# Patient Record
Sex: Male | Born: 1965 | Race: White | Hispanic: No | Marital: Married | Smoking: Former smoker
Health system: Southern US, Community
[De-identification: ages and names within clinical notes are randomized; demographics above are authoritative.]

## PROBLEM LIST (undated history)

## (undated) DIAGNOSIS — M1990 Unspecified osteoarthritis, unspecified site: Secondary | ICD-10-CM

## (undated) DIAGNOSIS — G4733 Obstructive sleep apnea (adult) (pediatric): Secondary | ICD-10-CM

## (undated) DIAGNOSIS — G4730 Sleep apnea, unspecified: Secondary | ICD-10-CM

## (undated) DIAGNOSIS — M545 Low back pain, unspecified: Secondary | ICD-10-CM

## (undated) DIAGNOSIS — T4145XA Adverse effect of unspecified anesthetic, initial encounter: Secondary | ICD-10-CM

## (undated) DIAGNOSIS — E291 Testicular hypofunction: Secondary | ICD-10-CM

## (undated) DIAGNOSIS — K219 Gastro-esophageal reflux disease without esophagitis: Secondary | ICD-10-CM

## (undated) DIAGNOSIS — J302 Other seasonal allergic rhinitis: Secondary | ICD-10-CM

## (undated) DIAGNOSIS — G8929 Other chronic pain: Secondary | ICD-10-CM

## (undated) DIAGNOSIS — T8859XA Other complications of anesthesia, initial encounter: Secondary | ICD-10-CM

## (undated) DIAGNOSIS — I10 Essential (primary) hypertension: Secondary | ICD-10-CM

## (undated) HISTORY — DX: Testicular hypofunction: E29.1

## (undated) HISTORY — PX: VASECTOMY: SHX75

## (undated) HISTORY — PX: BACK SURGERY: SHX140

## (undated) HISTORY — PX: APPENDECTOMY: SHX54

## (undated) HISTORY — DX: Obstructive sleep apnea (adult) (pediatric): G47.33

## (undated) HISTORY — PX: POSTERIOR LUMBAR FUSION: SHX6036

## (undated) HISTORY — DX: Other seasonal allergic rhinitis: J30.2

---

## 1988-10-04 HISTORY — PX: NASAL SINUS SURGERY: SHX719

## 1991-02-04 HISTORY — PX: TONSILLECTOMY: SUR1361

## 1999-02-04 HISTORY — PX: KNEE ARTHROSCOPY: SHX127

## 2001-06-23 ENCOUNTER — Encounter: Admission: RE | Admit: 2001-06-23 | Discharge: 2001-06-23 | Payer: Self-pay | Admitting: Family Medicine

## 2001-06-23 ENCOUNTER — Encounter: Payer: Self-pay | Admitting: Family Medicine

## 2001-06-24 ENCOUNTER — Encounter: Payer: Self-pay | Admitting: Family Medicine

## 2001-06-24 ENCOUNTER — Encounter: Admission: RE | Admit: 2001-06-24 | Discharge: 2001-06-24 | Payer: Self-pay | Admitting: Family Medicine

## 2005-08-13 ENCOUNTER — Inpatient Hospital Stay (HOSPITAL_COMMUNITY): Admission: RE | Admit: 2005-08-13 | Discharge: 2005-08-19 | Payer: Self-pay | Admitting: Neurological Surgery

## 2005-09-22 ENCOUNTER — Encounter: Admission: RE | Admit: 2005-09-22 | Discharge: 2005-09-22 | Payer: Self-pay | Admitting: Neurological Surgery

## 2006-02-23 ENCOUNTER — Encounter: Admission: RE | Admit: 2006-02-23 | Discharge: 2006-02-23 | Payer: Self-pay | Admitting: Neurological Surgery

## 2006-08-24 ENCOUNTER — Encounter: Admission: RE | Admit: 2006-08-24 | Discharge: 2006-08-24 | Payer: Self-pay | Admitting: Neurological Surgery

## 2010-06-21 NOTE — Op Note (Signed)
NAMECOLUMBUS, ICE NO.:  [id number]   MEDICAL RECORD NO.:  [id number]          PATIENT TYPE:  INP   LOCATION:  2899                         FACILITY:  [facility]   PHYSICIAN:  Tia Alert, MD     DATE OF BIRTH:  03/05/1965   DATE OF PROCEDURE:  08/13/2005  DATE OF DISCHARGE:                                 OPERATIVE REPORT   PREOPERATIVE DIAGNOSIS:  1.  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   POSTOPERATIVE DIAGNOSIS:  1.  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   PROCEDURE:  1.  Decompressive lumbar laminectomy, hemifacetectomy, and foraminotomies at      L4-L5 requiring more work than is usually required for simple posterior      lumbar interbody fusion procedure to decompress the central canal and L4      and -L5 nerve roots.  2.  Posterior lumbar interbody fusion L4-L5 utilizing a 10 x 22 mm PEEK      interbody cage packed with local autograft and VITOSS which was soaked      with a bone marrow aspirate obtained through a separate fascial incision      and a 10 x 22 mm Tangent interbody bone wedge.  3.  Intertransverse arthrodesis L4-L5 utilizing local bone and VITOSS soaked      with bone marrow aspirate.  4.  Nonsegmental fixation L4-L5 utilizing the Legacy pedicle screw system.  5.  Bone marrow aspirate obtained through a separate fascial incision.   SURGEON:  Tia Alert, M.D.   ASSISTANT:  Donalee Citrin, M.D.   ANESTHESIA:  General endotracheal anesthesia.   COMPLICATIONS:  None apparent.   INDICATIONS FOR PROCEDURE:  [name] is a 45 year old male who has a long  history of back pain with leg pain.  He had an MRI about a year ago which  showed some mild disk disease at L4-5 with moderate stenosis at this level.  He tried medical management for about a year including physical therapy and  steroid injections and pain medications.  He had a  repeat MRI about a month  ago which showed significant changes at L4-L5 with now endplate changes,  collapse of the disk space, and severe degenerative disk disease which was  much worse than his previous MRI.  He also continued to have moderate  stenosis at this level.  I recommended a posterior lumbar interbody fusion  at L4-L5.  He understood the risks, benefits, and expected outcome and  wished to proceed in hopes of improving his pain syndrome.   DESCRIPTION OF PROCEDURE:  The patient was taken to the operating room.  After induction of adequate generalized endotracheal anesthesia, he was  rolled in the prone position on chest rolls and all pressure points were  padded.  His lumbar region was prepped with DuraPrep and draped in the usual  sterile fashion.  A dorsal midline incision was made and carried down to the  lumbosacral fascia.  I then dissected superior to the fascia out over the  right iliac crest.  I opened the fascia there and used a large Tuohy needle  to perform bone marrow aspirate to soak 10 mL of VITOSS.  I then closed the  subcutaneous tissues to the fascia and then opened the fascia and took down  the fascia and the paraspinous musculature in a subperiosteal fashion to  expose the L4-L5 interspace.  Once interoperative fluoroscopy confirmed my  level, I took the dissection out over the transverse processes of L4 and L5.  I then used a combination of Leksell rongeur and Kerrison punches to perform  a complete laminectomy, hemifacetectomy, and foraminotomy at L4-L5. The L4  and L5 nerve roots were identified.  He had quite a bit of overgrown yellow  ligament which was removed in a piecemeal fashion with a Kerrison punch.  I  followed the L4 and L5 nerve roots distally into their respective foramina  marching along the pedicle wall.  I was able to undercut the lamina of L3,  also.  I then coagulated the epidural venous vasculature and incised the  disk space bilaterally  and performed the initial diskectomy with pituitary  rongeurs.  We then used a combination of the rotating cutter, round scraper,  and 10 mm cutting chisel to prepare the endplates bilaterally at L4-L5 after  distracting the interspace up to 10 mm.  Once the endplates were prepared  and complete diskectomy was done, we placed a 10 x 22 mm PEEK interbody cage  packed with local autograft and VITOSS soaked with the bone marrow aspirate  on the patient's right side and a Tangent interbody bone wedge on the left.  The midline was packed with local autograft and VITOSS.  We then turned our  attention to the nonsegmental fixation.  We localized the pedicle screw  entry zones using fluoroscopy and surface landmarks. We probed each pedicle  with a pedicle probe, tapped each pedicle with a 5/5 tap, made sure that we  had not broken through the cortex with a ball probe, and then placed 6.5 by  45 mm pedicle screws in the pedicles of L4 and L5 bilaterally.  We then  decorticated the transverse processes and placed a mixture of local  autograft out over these. This was mixed with the VITOSS.  We then used 40-  mm rods and placed these into the multiaxial screw heads of the pedicle  screws and locked these into position with the locking caps and the anti-  torque device after achieving compression on the grafts.  We then irrigated  once again with saline solution containing bacitracin, dried all bleeding  points with bipolar cautery, inspected our nerve roots once again with a  coronary dilator out into the foramina, lined the dura with Gelfoam, placed  a medium Hemovac drain through a separate stab incision, and closed the  muscle and fascia with interrupted #1 Vicryl, closed the subcutaneous and  subcuticular tissues with 2-0 and 3-0 Vicryl, and closed the skin with  Benzoin and Steri-Strips.  The drapes were removed.  A sterile dressing was applied.  The patient was awakened from general anesthesia  and transferred  to the recovery room in stable condition.  At the end of the procedure, all  sponge, needle and sponge counts were correct.      Tia Alert, MD  Electronically Signed     DSJ/MEDQ  D:  08/13/2005  T:  08/13/2005  Job:  [id number]

## 2010-06-21 NOTE — Discharge Summary (Signed)
Joe Holmes, Joe Holmes NO.:  [id number]   MEDICAL RECORD NO.:  [id number]          PATIENT TYPE:  INP   LOCATION:  3012                         FACILITY:  [facility]   PHYSICIAN:  Tia Alert, MD     DATE OF BIRTH:  18-Jan-1966   DATE OF ADMISSION:  08/13/2005  DATE OF DISCHARGE:  08/19/2005                                 DISCHARGE SUMMARY   ADMISSION DIAGNOSIS:  Degenerative disc disease with lumbar spinal stenosis  at L4-5.   PROCEDURE:  Posterior lumbar interbody fusion L4-5.   BRIEF HISTORY OF PRESENT ILLNESS:  Joe Holmes is a 45 year old male who has  a greater than 1-year history of back pain with leg pain.  He had an MRI  which showed severe degenerative disc disease at L4-5 with spinal stenosis.  Recommended posterior lumbar antibody fusion L4-5.  He understood the risks,  benefits, and expected outcome and wished to proceed.   HOSPITAL COURSE:  The patient was admitted on August 13, 2005 and taken to  operating room, where underwent a posterior lumbar interbody fusion L4-5.  The patient tolerated the procedure and was taken to recovery room and then  to the neurosurgical floor in stable condition.  For details of the  operative procedure, please see the dictated operative note.  The patient's  hospital course was fairly routine.  He did have a postoperative fever which  was worked up with a chest x-ray which just showed atelectasis.  Urinalysis  was clean.  The fever lasted for 2 days. Otherwise, he had appropriate  postoperative back pain, but no leg pain.  His incision remained clean, dry,  and intact.  He was tolerating a regular diet and ambulating in the halls  without difficulty.  Once he defervesced, he felt much better.  He was  afebrile for 24 hours and was discharged home in stable condition.   DISCHARGE MEDICATIONS:  1.  Percocet 5/325, 1-2 p.o. q.6 h, p.r.n. pain, 90 pills, and no refills.  2.  He was given 10 Fioricet for headache which he  experienced in the      hospital.  3.  He was also given Valium for muscle relaxation.   His return appointment is in 2 weeks with Dr. Yetta Barre.      Tia Alert, MD  Electronically Signed     DSJ/MEDQ  D:  08/19/2005  T:  08/19/2005  Job:  [id number]

## 2013-04-08 ENCOUNTER — Other Ambulatory Visit: Payer: Self-pay | Admitting: Neurological Surgery

## 2013-04-19 ENCOUNTER — Encounter (HOSPITAL_COMMUNITY): Payer: Self-pay | Admitting: Pharmacy Technician

## 2013-04-22 ENCOUNTER — Encounter (HOSPITAL_COMMUNITY)
Admission: RE | Admit: 2013-04-22 | Discharge: 2013-04-22 | Disposition: A | Discharge status: Home / Self Care | Payer: PRIVATE HEALTH INSURANCE | Source: Ambulatory Visit | Attending: Neurological Surgery | Admitting: Neurological Surgery

## 2013-04-22 ENCOUNTER — Encounter (HOSPITAL_COMMUNITY): Payer: Self-pay

## 2013-04-22 DIAGNOSIS — Z01810 Encounter for preprocedural cardiovascular examination: Secondary | ICD-10-CM | POA: Insufficient documentation

## 2013-04-22 DIAGNOSIS — Z01812 Encounter for preprocedural laboratory examination: Secondary | ICD-10-CM | POA: Insufficient documentation

## 2013-04-22 DIAGNOSIS — Z01818 Encounter for other preprocedural examination: Secondary | ICD-10-CM | POA: Insufficient documentation

## 2013-04-22 HISTORY — DX: Sleep apnea, unspecified: G47.30

## 2013-04-22 HISTORY — DX: Essential (primary) hypertension: I10

## 2013-04-22 HISTORY — DX: Unspecified osteoarthritis, unspecified site: M19.90

## 2013-04-22 HISTORY — DX: Gastro-esophageal reflux disease without esophagitis: K21.9

## 2013-04-22 HISTORY — DX: Adverse effect of unspecified anesthetic, initial encounter: T41.45XA

## 2013-04-22 HISTORY — DX: Other complications of anesthesia, initial encounter: T88.59XA

## 2013-04-22 LAB — CBC WITH DIFFERENTIAL/PLATELET
BASOS PCT: 0 % (ref 0–1)
Basophils Absolute: 0 10*3/uL (ref 0.0–0.1)
EOS ABS: 0.2 10*3/uL (ref 0.0–0.7)
Eosinophils Relative: 2 % (ref 0–5)
HCT: 46.4 % (ref 39.0–52.0)
HEMOGLOBIN: 16.7 g/dL (ref 13.0–17.0)
LYMPHS ABS: 2.8 10*3/uL (ref 0.7–4.0)
LYMPHS PCT: 28 % (ref 12–46)
MCH: 32.8 pg (ref 26.0–34.0)
MCHC: 36 g/dL (ref 30.0–36.0)
MCV: 91.2 fL (ref 78.0–100.0)
MONOS PCT: 11 % (ref 3–12)
Monocytes Absolute: 1.1 10*3/uL — ABNORMAL HIGH (ref 0.1–1.0)
NEUTROS PCT: 59 % (ref 43–77)
Neutro Abs: 5.8 10*3/uL (ref 1.7–7.7)
PLATELETS: 214 10*3/uL (ref 150–400)
RBC: 5.09 MIL/uL (ref 4.22–5.81)
RDW: 12.8 % (ref 11.5–15.5)
WBC: 9.9 10*3/uL (ref 4.0–10.5)

## 2013-04-22 LAB — BASIC METABOLIC PANEL
BUN: 10 mg/dL (ref 6–23)
CO2: 26 mEq/L (ref 19–32)
Calcium: 9.9 mg/dL (ref 8.4–10.5)
Chloride: 101 mEq/L (ref 96–112)
Creatinine, Ser: 1.06 mg/dL (ref 0.50–1.35)
GFR calc Af Amer: >90 mL/min (ref >90)
GFR calc non Af Amer: 81 mL/min — ABNORMAL LOW (ref >90)
GLUCOSE: 95 mg/dL (ref 70–99)
POTASSIUM: 4.8 meq/L (ref 3.7–5.3)
Sodium: 141 mEq/L (ref 137–147)

## 2013-04-22 LAB — TYPE AND SCREEN
ABO/RH(D): O POS
ANTIBODY SCREEN: NEGATIVE

## 2013-04-22 LAB — SURGICAL PCR SCREEN
MRSA, PCR: NEGATIVE
STAPHYLOCOCCUS AUREUS: NEGATIVE

## 2013-04-22 LAB — PROTIME-INR
INR: 1.08 (ref 0.00–1.49)
Prothrombin Time: 13.8 seconds (ref 11.6–15.2)

## 2013-04-22 NOTE — Pre-Procedure Instructions (Addendum)
Roanna Epleylvis S Morath  04/22/2013   Your procedure is scheduled on:  04/28/13  [facility] at 730 AM.  Call this number if you have problems the morning of surgery: [phone]   Remember:   Do not eat food or drink liquids after midnight.   Take these medicines the morning of surgery with A SIP OF WATER: tylenol, flexeril if needed           STOP all herbel meds, nsaids (aleve,naproxen,advil,ibuprofen) 5 days prior to surgery including vitamins, aspirin   Do not wear jewelry, make-up or nail polish.  Do not wear lotions, powders, or perfumes. You may wear deodorant.  Do not shave 48 hours prior to surgery. Men may shave face and neck.  Do not bring valuables to the hospital.  [facility]                  for any belongings or valuables.               Contacts, dentures or bridgework may not be worn into surgery.  Leave suitcase in the car. After surgery it may be brought to your room.  For patients admitted to the hospital, discharge time is determined by your                treatment team.               Patients discharged the day of surgery will not be allowed to drive  home.  Name and phone number of your driver:   Special Instructions:  Special Instructions: [facility] - Preparing for Surgery  Before surgery, you can play an important role.  Because skin is not sterile, your skin needs to be as free of germs as possible.  You can reduce the number of germs on you skin by washing with CHG (chlorahexidine gluconate) soap before surgery.  CHG is an antiseptic cleaner which kills germs and bonds with the skin to continue killing germs even after washing.  Please DO NOT use if you have an allergy to CHG or antibacterial soaps.  If your skin becomes reddened/irritated stop using the CHG and inform your nurse when you arrive at Short Stay.  Do not shave (including legs and underarms) for at least 48 hours prior to the first CHG shower.  You may  shave your face.  Please follow these instructions carefully:   1.  Shower with CHG Soap the night before surgery and the morning of Surgery.  2.  If you choose to wash your hair, wash your hair first as usual with your normal shampoo.  3.  After you shampoo, rinse your hair and body thoroughly to remove the Shampoo.  4.  Use CHG as you would any other liquid soap.  You can apply chg directly  to the skin and wash gently with scrungie or a clean washcloth.  5.  Apply the CHG Soap to your body ONLY FROM THE NECK DOWN.  Do not use on open wounds or open sores.  Avoid contact with your eyes ears, mouth and genitals (private parts).  Wash genitals (private parts)       with your normal soap.  6.  Wash thoroughly, paying special attention to the area where your surgery will be performed.  7.  Thoroughly rinse your body with warm water from the neck down.  8.  DO NOT shower/wash with your normal soap after using and rinsing  off the CHG Soap.  9.  Pat yourself dry with a clean towel.            10.  Wear clean pajamas.            11.  Place clean sheets on your bed the night of your first shower and do not sleep with pets.  Day of Surgery  Do not apply any lotions/deodorants the morning of surgery.  Please wear clean clothes to the hospital/surgery center.   Please read over the following fact sheets that you were given: Pain Booklet, Coughing and Deep Breathing, Blood Transfusion Information, MRSA Information and Surgical Site Infection Prevention

## 2013-04-27 MED ORDER — CEFAZOLIN SODIUM-DEXTROSE 2-3 GM-% IV SOLR
2.0000 g | INTRAVENOUS | Status: AC
  Administered 2013-04-28: 3 g via INTRAVENOUS
  Filled 2013-04-27: qty 50

## 2013-04-27 MED ORDER — DEXAMETHASONE SODIUM PHOSPHATE 10 MG/ML IJ SOLN
10.0000 mg | INTRAMUSCULAR | Status: AC
  Administered 2013-04-28: 10 mg via INTRAVENOUS
  Filled 2013-04-27: qty 1

## 2013-04-28 ENCOUNTER — Inpatient Hospital Stay (HOSPITAL_COMMUNITY): Payer: PRIVATE HEALTH INSURANCE | Admitting: Anesthesiology

## 2013-04-28 ENCOUNTER — Encounter (HOSPITAL_COMMUNITY): Payer: Self-pay | Admitting: *Deleted

## 2013-04-28 ENCOUNTER — Encounter (HOSPITAL_COMMUNITY): Payer: PRIVATE HEALTH INSURANCE | Admitting: Anesthesiology

## 2013-04-28 ENCOUNTER — Inpatient Hospital Stay (HOSPITAL_COMMUNITY): Payer: PRIVATE HEALTH INSURANCE

## 2013-04-28 ENCOUNTER — Inpatient Hospital Stay (HOSPITAL_COMMUNITY)
Admission: RE | Admit: 2013-04-28 | Discharge: 2013-05-01 | DRG: 460 | Disposition: A | Discharge status: Home / Self Care | Payer: PRIVATE HEALTH INSURANCE | Source: Ambulatory Visit | Attending: Neurological Surgery | Admitting: Neurological Surgery

## 2013-04-28 ENCOUNTER — Encounter (HOSPITAL_COMMUNITY)
Admission: RE | Disposition: A | Discharge status: Home / Self Care | Payer: Self-pay | Source: Ambulatory Visit | Attending: Neurological Surgery

## 2013-04-28 DIAGNOSIS — I10 Essential (primary) hypertension: Secondary | ICD-10-CM | POA: Diagnosis present

## 2013-04-28 DIAGNOSIS — K219 Gastro-esophageal reflux disease without esophagitis: Secondary | ICD-10-CM | POA: Diagnosis present

## 2013-04-28 DIAGNOSIS — Z981 Arthrodesis status: Secondary | ICD-10-CM

## 2013-04-28 DIAGNOSIS — Z79899 Other long term (current) drug therapy: Secondary | ICD-10-CM

## 2013-04-28 DIAGNOSIS — Z87891 Personal history of nicotine dependence: Secondary | ICD-10-CM

## 2013-04-28 DIAGNOSIS — M48061 Spinal stenosis, lumbar region without neurogenic claudication: Principal | ICD-10-CM | POA: Diagnosis present

## 2013-04-28 HISTORY — DX: Other chronic pain: G89.29

## 2013-04-28 HISTORY — DX: Low back pain: M54.5

## 2013-04-28 HISTORY — DX: Low back pain, unspecified: M54.50

## 2013-04-28 SURGERY — POSTERIOR LUMBAR FUSION 1 LEVEL
Anesthesia: General | Site: Back

## 2013-04-28 MED ORDER — FENTANYL CITRATE 0.05 MG/ML IJ SOLN
INTRAMUSCULAR | Status: AC
  Filled 2013-04-28: qty 5

## 2013-04-28 MED ORDER — DIAZEPAM 5 MG PO TABS
ORAL_TABLET | ORAL | Status: AC
  Filled 2013-04-28: qty 1

## 2013-04-28 MED ORDER — SODIUM CHLORIDE 0.9 % IJ SOLN: 3.0000 mL | INTRAMUSCULAR | Status: DC | PRN

## 2013-04-28 MED ORDER — OXYCODONE HCL 5 MG/5ML PO SOLN: 5.0000 mg | Freq: Once | ORAL | Status: AC | PRN

## 2013-04-28 MED ORDER — OXYCODONE-ACETAMINOPHEN 5-325 MG PO TABS
1.0000 | ORAL_TABLET | ORAL | Status: DC | PRN
  Administered 2013-04-28: 1 via ORAL
  Administered 2013-04-29: 2 via ORAL
  Filled 2013-04-28: qty 1
  Filled 2013-04-28: qty 2

## 2013-04-28 MED ORDER — MENTHOL 3 MG MT LOZG: 1.0000 | LOZENGE | OROMUCOSAL | Status: DC | PRN

## 2013-04-28 MED ORDER — SODIUM CHLORIDE 0.9 % IJ SOLN
3.0000 mL | Freq: Two times a day (BID) | INTRAMUSCULAR | Status: DC
Start: 2013-04-28 — End: 2013-05-01
  Administered 2013-04-28 – 2013-04-30 (×4): 3 mL via INTRAVENOUS

## 2013-04-28 MED ORDER — HYDROMORPHONE HCL PF 1 MG/ML IJ SOLN
INTRAMUSCULAR | Status: AC
  Filled 2013-04-28: qty 1

## 2013-04-28 MED ORDER — GLYCOPYRROLATE 0.2 MG/ML IJ SOLN
INTRAMUSCULAR | Status: AC
  Filled 2013-04-28: qty 3

## 2013-04-28 MED ORDER — DEXAMETHASONE SODIUM PHOSPHATE 4 MG/ML IJ SOLN
4.0000 mg | Freq: Four times a day (QID) | INTRAMUSCULAR | Status: DC
  Filled 2013-04-28 (×12): qty 1

## 2013-04-28 MED ORDER — BUPIVACAINE HCL (PF) 0.25 % IJ SOLN
INTRAMUSCULAR | Status: DC | PRN
  Administered 2013-04-28: 5 mL

## 2013-04-28 MED ORDER — ACETAMINOPHEN 325 MG PO TABS: 325.0000 mg | ORAL_TABLET | ORAL | Status: DC | PRN

## 2013-04-28 MED ORDER — CEFAZOLIN SODIUM 1-5 GM-% IV SOLN
INTRAVENOUS | Status: AC
  Administered 2013-04-28: 1000 mg
  Filled 2013-04-28: qty 50

## 2013-04-28 MED ORDER — 0.9 % SODIUM CHLORIDE (POUR BTL) OPTIME
TOPICAL | Status: DC | PRN
  Administered 2013-04-28: 1000 mL

## 2013-04-28 MED ORDER — ONDANSETRON HCL 4 MG/2ML IJ SOLN
4.0000 mg | INTRAMUSCULAR | Status: DC | PRN
  Administered 2013-04-28: 4 mg via INTRAVENOUS
  Filled 2013-04-28: qty 2

## 2013-04-28 MED ORDER — HYDROMORPHONE HCL PF 1 MG/ML IJ SOLN
0.2500 mg | INTRAMUSCULAR | Status: DC | PRN
  Administered 2013-04-28 (×4): 0.5 mg via INTRAVENOUS

## 2013-04-28 MED ORDER — LIDOCAINE HCL (CARDIAC) 20 MG/ML IV SOLN
INTRAVENOUS | Status: DC | PRN
  Administered 2013-04-28: 80 mg via INTRAVENOUS

## 2013-04-28 MED ORDER — ACETAMINOPHEN 325 MG PO TABS: 650.0000 mg | ORAL_TABLET | ORAL | Status: DC | PRN

## 2013-04-28 MED ORDER — CELECOXIB 200 MG PO CAPS
200.0000 mg | ORAL_CAPSULE | Freq: Two times a day (BID) | ORAL | Status: DC
  Administered 2013-04-28 – 2013-05-01 (×6): 200 mg via ORAL
  Filled 2013-04-28 (×7): qty 1

## 2013-04-28 MED ORDER — PROPOFOL 10 MG/ML IV BOLUS
INTRAVENOUS | Status: AC
  Filled 2013-04-28: qty 20

## 2013-04-28 MED ORDER — CEFAZOLIN SODIUM 1-5 GM-% IV SOLN
1.0000 g | Freq: Three times a day (TID) | INTRAVENOUS | Status: AC
  Administered 2013-04-28 – 2013-04-29 (×2): 1 g via INTRAVENOUS
  Filled 2013-04-28 (×2): qty 50

## 2013-04-28 MED ORDER — MIDAZOLAM HCL 2 MG/2ML IJ SOLN
INTRAMUSCULAR | Status: AC
  Filled 2013-04-28: qty 2

## 2013-04-28 MED ORDER — ONDANSETRON HCL 4 MG/2ML IJ SOLN
INTRAMUSCULAR | Status: AC
  Filled 2013-04-28: qty 2

## 2013-04-28 MED ORDER — DIAZEPAM 5 MG PO TABS
5.0000 mg | ORAL_TABLET | Freq: Four times a day (QID) | ORAL | Status: DC | PRN
  Administered 2013-04-28 – 2013-04-29 (×3): 5 mg via ORAL
  Filled 2013-04-28 (×3): qty 1

## 2013-04-28 MED ORDER — POTASSIUM CHLORIDE IN NACL 20-0.9 MEQ/L-% IV SOLN
INTRAVENOUS | Status: DC
  Administered 2013-04-28: 75 mL/h via INTRAVENOUS
  Filled 2013-04-28 (×6): qty 1000

## 2013-04-28 MED ORDER — SODIUM CHLORIDE 0.9 % IR SOLN
Status: DC | PRN
  Administered 2013-04-28: 10:00:00

## 2013-04-28 MED ORDER — SENNA 8.6 MG PO TABS
1.0000 | ORAL_TABLET | Freq: Two times a day (BID) | ORAL | Status: DC
  Administered 2013-04-28 – 2013-05-01 (×6): 8.6 mg via ORAL
  Filled 2013-04-28 (×7): qty 1

## 2013-04-28 MED ORDER — MIDAZOLAM HCL 5 MG/5ML IJ SOLN
INTRAMUSCULAR | Status: DC | PRN
  Administered 2013-04-28: 2 mg via INTRAVENOUS

## 2013-04-28 MED ORDER — LACTATED RINGERS IV SOLN
INTRAVENOUS | Status: DC
  Administered 2013-04-28: 09:00:00 via INTRAVENOUS

## 2013-04-28 MED ORDER — MORPHINE SULFATE 2 MG/ML IJ SOLN
1.0000 mg | INTRAMUSCULAR | Status: DC | PRN
  Administered 2013-04-28: 2 mg via INTRAVENOUS
  Filled 2013-04-28: qty 1

## 2013-04-28 MED ORDER — ACETAMINOPHEN 160 MG/5ML PO SOLN
325.0000 mg | ORAL | Status: DC | PRN
  Filled 2013-04-28: qty 20.3

## 2013-04-28 MED ORDER — THROMBIN 20000 UNITS EX SOLR
OROMUCOSAL | Status: DC | PRN
  Administered 2013-04-28: 11:00:00 via TOPICAL

## 2013-04-28 MED ORDER — ROCURONIUM BROMIDE 100 MG/10ML IV SOLN
INTRAVENOUS | Status: DC | PRN
  Administered 2013-04-28 (×2): 50 mg via INTRAVENOUS

## 2013-04-28 MED ORDER — SODIUM CHLORIDE 0.9 % IV SOLN: 250.0000 mL | INTRAVENOUS | Status: DC

## 2013-04-28 MED ORDER — LACTATED RINGERS IV SOLN
INTRAVENOUS | Status: DC | PRN
  Administered 2013-04-28 (×3): via INTRAVENOUS

## 2013-04-28 MED ORDER — ACETAMINOPHEN 650 MG RE SUPP: 650.0000 mg | RECTAL | Status: DC | PRN

## 2013-04-28 MED ORDER — OXYCODONE HCL 5 MG PO TABS
5.0000 mg | ORAL_TABLET | Freq: Once | ORAL | Status: AC | PRN
  Administered 2013-04-28: 5 mg via ORAL

## 2013-04-28 MED ORDER — PROPOFOL 10 MG/ML IV BOLUS
INTRAVENOUS | Status: DC | PRN
  Administered 2013-04-28: 200 mg via INTRAVENOUS

## 2013-04-28 MED ORDER — PHENOL 1.4 % MT LIQD: 1.0000 | OROMUCOSAL | Status: DC | PRN

## 2013-04-28 MED ORDER — LISINOPRIL 10 MG PO TABS
10.0000 mg | ORAL_TABLET | Freq: Every day | ORAL | Status: DC
  Administered 2013-04-29 – 2013-05-01 (×3): 10 mg via ORAL
  Filled 2013-04-28 (×3): qty 1

## 2013-04-28 MED ORDER — FENTANYL CITRATE 0.05 MG/ML IJ SOLN
INTRAMUSCULAR | Status: DC | PRN
  Administered 2013-04-28 (×5): 50 ug via INTRAVENOUS
  Administered 2013-04-28: 150 ug via INTRAVENOUS
  Administered 2013-04-28 (×2): 50 ug via INTRAVENOUS

## 2013-04-28 MED ORDER — THROMBIN 5000 UNITS EX SOLR
OROMUCOSAL | Status: DC | PRN
  Administered 2013-04-28: 11:00:00 via TOPICAL

## 2013-04-28 MED ORDER — LIDOCAINE HCL (CARDIAC) 20 MG/ML IV SOLN
INTRAVENOUS | Status: AC
  Filled 2013-04-28: qty 5

## 2013-04-28 MED ORDER — NEOSTIGMINE METHYLSULFATE 1 MG/ML IJ SOLN
INTRAMUSCULAR | Status: DC | PRN
  Administered 2013-04-28: 4 mg via INTRAVENOUS

## 2013-04-28 MED ORDER — ONDANSETRON HCL 4 MG/2ML IJ SOLN
INTRAMUSCULAR | Status: DC | PRN
  Administered 2013-04-28: 4 mg via INTRAVENOUS

## 2013-04-28 MED ORDER — OXYCODONE HCL 5 MG PO TABS
ORAL_TABLET | ORAL | Status: AC
  Filled 2013-04-28: qty 1

## 2013-04-28 MED ORDER — ALUM & MAG HYDROXIDE-SIMETH 200-200-20 MG/5ML PO SUSP
15.0000 mL | Freq: Once | ORAL | Status: AC
  Administered 2013-04-28: 15 mL via ORAL
  Filled 2013-04-28: qty 30

## 2013-04-28 MED ORDER — DEXAMETHASONE 4 MG PO TABS
4.0000 mg | ORAL_TABLET | Freq: Four times a day (QID) | ORAL | Status: DC
  Administered 2013-04-28 – 2013-05-01 (×11): 4 mg via ORAL
  Filled 2013-04-28 (×15): qty 1

## 2013-04-28 MED ORDER — ONDANSETRON HCL 4 MG/2ML IJ SOLN: 4.0000 mg | Freq: Once | INTRAMUSCULAR | Status: DC | PRN

## 2013-04-28 MED ORDER — GLYCOPYRROLATE 0.2 MG/ML IJ SOLN
INTRAMUSCULAR | Status: DC | PRN
  Administered 2013-04-28: 0.6 mg via INTRAVENOUS

## 2013-04-28 SURGICAL SUPPLY — 65 items
APL SKNCLS STERI-STRIP NONHPOA (GAUZE/BANDAGES/DRESSINGS) ×1
BAG DECANTER FOR FLEXI CONT (MISCELLANEOUS) ×2 IMPLANT
BENZOIN TINCTURE PRP APPL 2/3 (GAUZE/BANDAGES/DRESSINGS) ×2 IMPLANT
BLADE SURG ROTATE 9660 (MISCELLANEOUS) IMPLANT
BONE MATRIX OSTEOCEL PRO MED (Bone Implant) ×1 IMPLANT
BUR MATCHSTICK NEURO 3.0 LAGG (BURR) ×2 IMPLANT
CAGE COROENT 10X9X28-8 LUMBAR (Cage) ×1 IMPLANT
CANISTER SUCT 3000ML (MISCELLANEOUS) ×2 IMPLANT
CONT SPEC 4OZ CLIKSEAL STRL BL (MISCELLANEOUS) ×4 IMPLANT
COVER BACK TABLE 24X17X13 BIG (DRAPES) IMPLANT
COVER TABLE BACK 60X90 (DRAPES) ×2 IMPLANT
DRAPE C-ARM 42X72 X-RAY (DRAPES) ×4 IMPLANT
DRAPE LAPAROTOMY 100X72X124 (DRAPES) ×2 IMPLANT
DRAPE POUCH INSTRU U-SHP 10X18 (DRAPES) ×2 IMPLANT
DRAPE SURG 17X23 STRL (DRAPES) ×2 IMPLANT
DRESSING TELFA 8X3 (GAUZE/BANDAGES/DRESSINGS) ×2 IMPLANT
DRSG OPSITE 4X5.5 SM (GAUZE/BANDAGES/DRESSINGS) ×4 IMPLANT
DURAPREP 26ML APPLICATOR (WOUND CARE) ×2 IMPLANT
ELECT REM PT RETURN 9FT ADLT (ELECTROSURGICAL) ×2
ELECTRODE REM PT RTRN 9FT ADLT (ELECTROSURGICAL) ×1 IMPLANT
EVACUATOR 1/8 PVC DRAIN (DRAIN) ×2 IMPLANT
GAUZE SPONGE 4X4 16PLY XRAY LF (GAUZE/BANDAGES/DRESSINGS) IMPLANT
GLOVE BIO SURGEON STRL SZ8 (GLOVE) ×4 IMPLANT
GLOVE BIOGEL PI IND STRL 7.0 (GLOVE) IMPLANT
GLOVE BIOGEL PI IND STRL 7.5 (GLOVE) IMPLANT
GLOVE BIOGEL PI INDICATOR 7.0 (GLOVE) ×3
GLOVE BIOGEL PI INDICATOR 7.5 (GLOVE) ×3
GLOVE ECLIPSE 7.5 STRL STRAW (GLOVE) ×1 IMPLANT
GLOVE SS BIOGEL STRL SZ 6.5 (GLOVE) IMPLANT
GLOVE SUPERSENSE BIOGEL SZ 6.5 (GLOVE) ×3
GOWN BRE IMP SLV AUR LG STRL (GOWN DISPOSABLE) IMPLANT
GOWN BRE IMP SLV AUR XL STRL (GOWN DISPOSABLE) ×4 IMPLANT
GOWN STRL REIN 2XL LVL4 (GOWN DISPOSABLE) IMPLANT
GOWN STRL REUS W/ TWL LRG LVL3 (GOWN DISPOSABLE) IMPLANT
GOWN STRL REUS W/ TWL XL LVL3 (GOWN DISPOSABLE) IMPLANT
GOWN STRL REUS W/TWL LRG LVL3 (GOWN DISPOSABLE) ×2
GOWN STRL REUS W/TWL XL LVL3 (GOWN DISPOSABLE) ×8
HEMOSTAT POWDER KIT SURGIFOAM (HEMOSTASIS) IMPLANT
KIT BASIN OR (CUSTOM PROCEDURE TRAY) ×2 IMPLANT
KIT ROOM TURNOVER OR (KITS) ×2 IMPLANT
MILL MEDIUM DISP (BLADE) ×1 IMPLANT
NDL HYPO 25X1 1.5 SAFETY (NEEDLE) ×1 IMPLANT
NEEDLE HYPO 25X1 1.5 SAFETY (NEEDLE) ×2 IMPLANT
NS IRRIG 1000ML POUR BTL (IV SOLUTION) ×2 IMPLANT
PACK LAMINECTOMY NEURO (CUSTOM PROCEDURE TRAY) ×2 IMPLANT
PAD ARMBOARD 7.5X6 YLW CONV (MISCELLANEOUS) ×7 IMPLANT
ROD PREBENT 6.35X50 (Rod) ×2 IMPLANT
SCREW PEDICLE VA L635 6.5X45M (Screw) ×2 IMPLANT
SCREW SET NON BREAK OFF (Screw) ×4 IMPLANT
SEALER BIPOLAR AQUA 5.0 SHEATH (INSTRUMENTS) ×1 IMPLANT
SPONGE GAUZE 4X4 12PLY (GAUZE/BANDAGES/DRESSINGS) ×1 IMPLANT
SPONGE LAP 4X18 X RAY DECT (DISPOSABLE) IMPLANT
SPONGE SURGIFOAM ABS GEL 100 (HEMOSTASIS) ×2 IMPLANT
STAPLER SKIN PROX WIDE 3.9 (STAPLE) ×1 IMPLANT
STRIP CLOSURE SKIN 1/2X4 (GAUZE/BANDAGES/DRESSINGS) ×3 IMPLANT
SUT VIC AB 0 CT1 18XCR BRD8 (SUTURE) ×1 IMPLANT
SUT VIC AB 0 CT1 8-18 (SUTURE) ×2
SUT VIC AB 2-0 CP2 18 (SUTURE) ×4 IMPLANT
SUT VIC AB 3-0 SH 8-18 (SUTURE) ×8 IMPLANT
SYR 20ML ECCENTRIC (SYRINGE) ×2 IMPLANT
TOWEL OR 17X24 6PK STRL BLUE (TOWEL DISPOSABLE) ×2 IMPLANT
TOWEL OR 17X26 10 PK STRL BLUE (TOWEL DISPOSABLE) ×2 IMPLANT
TRAP SPECIMEN MUCOUS 40CC (MISCELLANEOUS) IMPLANT
TRAY FOLEY CATH 14FRSI W/METER (CATHETERS) ×2 IMPLANT
WATER STERILE IRR 1000ML POUR (IV SOLUTION) ×2 IMPLANT

## 2013-04-28 NOTE — Anesthesia Postprocedure Evaluation (Signed)
  Anesthesia Post-op Note  Patient: Joe EpleyAlvis S Holmes  Procedure(s) Performed: Procedure(s): POSTERIOR LUMBAR FUSION 1 LEVEL LEVEL THREE/FOUR (N/A)  Patient Location: PACU  Anesthesia Type:General  Level of Consciousness: awake, alert  and oriented  Airway and Oxygen Therapy: Patient Spontanous Breathing  Post-op Pain: mild  Post-op Assessment: Post-op Vital signs reviewed, Patient's Cardiovascular Status Stable, Respiratory Function Stable, Patent Airway, No signs of Nausea or vomiting and Pain level controlled  Post-op Vital Signs: Reviewed and stable  Complications: No apparent anesthesia complications

## 2013-04-28 NOTE — Op Note (Signed)
04/28/2013  1:48 PM  PATIENT:  Joe Holmes  48 y.o. male  PRE-OPERATIVE DIAGNOSIS:  Adjacent level stenosis L3-4 the back and leg pain  POST-OPERATIVE DIAGNOSIS:  Same  PROCEDURE:   1. Decompressive lumbar laminectomy L3-4 requiring more work than would be required for a simple exposure of the disk for PLIF in order to adequately decompress the neural elements and address the spinal stenosis 2. Posterior lumbar interbody fusion L3-4 using PEEK interbody cages packed with morcellized allograft and autograft 3. Posterior fixation L3-4 using Sofamor Danek Legacy pedicle screws.  4. Intertransverse arthrodesis L3-4 using morcellized autograft and allograft. 5. removal of nonsegmental instrumentation L4-5  SURGEON:  Marikay Alaravid Jolene Guyett, MD  ASSISTANTS: Dr. Newell CoralNudelman  ANESTHESIA:  General  EBL: 250 ml  Total I/O In: 2350 [I.V.:2350] Out: 700 [Urine:450; Blood:250]  BLOOD ADMINISTERED:none  DRAINS: Hemovac   INDICATION FOR PROCEDURE: This patient underwent a previous lumbar fusion at L4-5. He did well with that. He did develop progress is back and leg pain over the last few months. MRI showed adjacent level stenosis at L3-4. He had a solid fusion at L4-5. I recommended a decompression and instrument infusion was he failed an adequate trial of medical management. Patient understood the risks, benefits, and alternatives and potential outcomes and wished to proceed.  PROCEDURE DETAILS:  The patient was brought to the operating room. After induction of generalized endotracheal anesthesia the patient was rolled into the prone position on chest rolls and all pressure points were padded. The patient's lumbar region was cleaned and then prepped with DuraPrep and draped in the usual sterile fashion. Anesthesia was injected and then a dorsal midline incision was made and carried down to the lumbosacral fascia. The fascia was opened and the paraspinous musculature was taken down in a subperiosteal fashion  to expose L3-4 as well as the previously placed hardware.. A self-retaining retractor was placed. Intraoperative fluoroscopy confirmed my level, and I started removal of the old hardware. The locking caps were removed as well as the right. I removed the inferior screws and left the L4 screws in place. They were well seated. I then turned my attention to the decompression and the spinous process was removed and complete lumbar laminectomies, hemi- facetectomies, and foraminotomies were performed at L3-4. The patient had significant spinal stenosis and this required more work than would be required for a simple exposure of the disc for posterior lumbar interbody fusion. Much more generous decompression was undertaken in order to adequately decompress the neural elements and address the patient's leg pain. The yellow ligament was removed to expose the underlying dura and nerve roots, and generous foraminotomies were performed to adequately decompress the neural elements. Both the exiting and traversing nerve roots were decompressed on both sides until a coronary dilator passed easily along the nerve roots. Once the decompression was complete, I turned my attention to the posterior lower lumbar interbody fusion. The epidural venous vasculature was coagulated and cut sharply. Disc space was incised and the initial discectomy was performed with pituitary rongeurs. The disc space was distracted with sequential distractors to a height of 10 mm. We then used a series of scrapers and shavers to prepare the endplates for fusion. The midline was prepared with Epstein curettes. Once the complete discectomy was finished, we packed an appropriate sized peek interbody cage (10 mm x 28 mm x 8 lordotic ) with local autograft and morcellized allograft, gently retracted the nerve root, and tapped the cage into position at L3-4.  The midline between the cages was packed with morselized autograft and allograft. We then turned our  attention to the placement of the upper L3 pedicle screws. The pedicle screw entry zones were identified utilizing surface landmarks and fluoroscopy. I probed each pedicle with the pedicle probe and tapped each pedicle with the appropriate tap. We palpated with a ball probe to assure no break in the cortex. We then placed 6.5 x 45 mm pedicle screw into the pedicles bilaterally at L3. We then decorticated the transverse processes and laid a mixture of morcellized autograft and allograft out over these to perform intertransverse arthrodesis at L3-4. We then placed lordotic rods into the multiaxial screw heads of the pedicle screws and locked these in position with the locking caps and anti-torque device. We then checked our construct with AP and lateral fluoroscopy. Irrigated with copious amounts of bacitracin-containing saline solution. Placed a medium Hemovac drain through separate stab incision. Inspected the nerve roots once again to assure adequate decompression, lined to the dura with Gelfoam, and closed the muscle and the fascia with 0 Vicryl. Closed the subcutaneous tissues with 2-0 Vicryl and subcuticular tissues with 3-0 Vicryl. The skin was closed with benzoin and Steri-Strips. Dressing was then applied, the patient was awakened from general anesthesia and transported to the recovery room in stable condition. At the end of the procedure all sponge, needle and instrument counts were correct.   PLAN OF CARE: Admit to inpatient   PATIENT DISPOSITION:  PACU - hemodynamically stable.   Delay start of Pharmacological VTE agent (>24hrs) due to surgical blood loss or risk of bleeding:  yes

## 2013-04-28 NOTE — H&P (Signed)
Subjective: Patient is a 48 y.o. male admitted for PLIF L3-4 adjacent to previous fusion at L4-5. Onset of symptoms was a few months ago, gradually worsening since that time.  The pain is rated severe, and is located at the across the lower back and radiates to right lower extremity. The pain is described as aching and occurs upon awakening. The symptoms have been progressive. Symptoms are exacerbated by exercise and standing. MRI or CT showed adjacent level stenosis L3-4 with solid fusion L4-5.   Past Medical History  Diagnosis Date  . Hypertension   . Sleep apnea     has cpap does not use  . GERD (gastroesophageal reflux disease)     occ  . Arthritis   . Complication of anesthesia     irritable,?has panic attacks    Past Surgical History  Procedure Laterality Date  . Back surgery  07  . Appendectomy      5 yrs okd  . Tonsillectomy    . Knee arthroscopy Right 01    meniscus  . Vasectomy    . Nasal sinus surgery      Prior to Admission medications   Medication Sig Start Date End Date Taking? Authorizing Provider  acetaminophen (TYLENOL) 500 MG tablet Take 1,500 mg by mouth every 6 (six) hours as needed for mild pain.   Yes Historical Provider, MD  cyclobenzaprine (FLEXERIL) 10 MG tablet Take 10 mg by mouth 2 (two) times daily as needed for muscle spasms.   Yes Historical Provider, MD  lisinopril (PRINIVIL,ZESTRIL) 10 MG tablet Take 10 mg by mouth daily.   Yes Historical Provider, MD   Allergies  Allergen Reactions  . Codeine Nausea And Vomiting    History  Substance Use Topics  . Smoking status: Former Smoker -- 0.50 packs/day for 2 years    Types: Cigarettes    Quit date: 04/23/1983  . Smokeless tobacco: Current User    Types: Snuff  . Alcohol Use: 21.0 oz/week    35 Shots of liquor per week     Comment: 15-20- oz per day    History reviewed. No pertinent family history.   Review of Systems  Positive ROS: neg  All other systems have been reviewed and were  otherwise negative with the exception of those mentioned in the HPI and as above.  Objective: Vital signs in last 24 hours: Temp:  [98.3 F (36.8 C)] 98.3 F (36.8 C) (03/26 0756) Pulse Rate:  [94] 94 (03/26 0756) BP: (146)/(86) 146/86 mmHg (03/26 0756) SpO2:  [94 %] 94 % (03/26 0756) Weight:  [131.997 kg (291 lb)] 131.997 kg (291 lb) (03/26 0756)  General Appearance: Alert, cooperative, no distress, appears stated age Head: Normocephalic, without obvious abnormality, atraumatic Eyes: PERRL, conjunctiva/corneas clear, EOM's intact    Neck: Supple, symmetrical, trachea midline Back: Symmetric, no curvature, ROM normal, no CVA tenderness Lungs:  respirations unlabored Heart: Regular rate and rhythm Abdomen: Soft, non-tender Extremities: Extremities normal, atraumatic, no cyanosis or edema Pulses: 2+ and symmetric all extremities Skin: Skin color, texture, turgor normal, no rashes or lesions  NEUROLOGIC:   Mental status: Alert and oriented x4,  no aphasia, good attention span, fund of knowledge, and memory Motor Exam - grossly normal Sensory Exam - grossly normal Reflexes: 1+ Coordination - grossly normal Gait - grossly normal Balance - grossly normal Cranial Nerves: I: smell Not tested  II: visual acuity  OS: nl    OD: nl  II: visual fields Full to confrontation  II: pupils  Equal, round, reactive to light  III,VII: ptosis None  III,IV,VI: extraocular muscles  Full ROM  V: mastication Normal  V: facial light touch sensation  Normal  V,VII: corneal reflex  Present  VII: facial muscle function - upper  Normal  VII: facial muscle function - lower Normal  VIII: hearing Not tested  IX: soft palate elevation  Normal  IX,X: gag reflex Present  XI: trapezius strength  5/5  XI: sternocleidomastoid strength 5/5  XI: neck flexion strength  5/5  XII: tongue strength  Normal    Data Review Lab Results  Component Value Date   WBC 9.9 04/22/2013   HGB 16.7 04/22/2013   HCT 46.4  04/22/2013   MCV 91.2 04/22/2013   PLT 214 04/22/2013   Lab Results  Component Value Date   NA 141 04/22/2013   K 4.8 04/22/2013   CL 101 04/22/2013   CO2 26 04/22/2013   BUN 10 04/22/2013   CREATININE 1.06 04/22/2013   GLUCOSE 95 04/22/2013   Lab Results  Component Value Date   INR 1.08 04/22/2013    Assessment/Plan: Patient admitted for posterior lumbar interbody fusion L3-4 with extension of hardware. Patient has failed a reasonable attempt at conservative therapy.  I explained the condition and procedure to the patient and answered any questions.  Patient wishes to proceed with procedure as planned. Understands risks/ benefits and typical outcomes of procedure.   Laurene Melendrez S 04/28/2013 9:02 AM

## 2013-04-28 NOTE — Preoperative (Signed)
Beta Blockers   Reason not to administer Beta Blockers:Not Applicable 

## 2013-04-28 NOTE — Anesthesia Preprocedure Evaluation (Signed)
Anesthesia Evaluation  Patient identified by MRN, date of birth, ID band  Reviewed: Allergy & Precautions, H&P , NPO status , Patient's Chart, lab work & pertinent test results  History of Anesthesia Complications Negative for: history of anesthetic complications  Airway Mallampati: II TM Distance: >3 FB Neck ROM: Full    Dental  (+) Teeth Intact   Pulmonary sleep apnea , former smoker,  Non compliant cpap breath sounds clear to auscultation        Cardiovascular hypertension, Pt. on medications - angina- CAD, - CHF and - DOE - dysrhythmias - Valvular Problems/MurmursRhythm:Regular Rate:Normal     Neuro/Psych Low back pain, numbness in right leg negative psych ROS   GI/Hepatic Neg liver ROS, GERD-  Medicated and Controlled,  Endo/Other  negative endocrine ROS  Renal/GU negative Renal ROS     Musculoskeletal   Abdominal   Peds  Hematology negative hematology ROS (+)   Anesthesia Other Findings   Reproductive/Obstetrics                           Anesthesia Physical Anesthesia Plan  ASA: II  Anesthesia Plan: General   Post-op Pain Management:    Induction: Intravenous  Airway Management Planned: Oral ETT  Additional Equipment: None  Intra-op Plan:   Post-operative Plan: Extubation in OR  Informed Consent: I have reviewed the patients History and Physical, chart, labs and discussed the procedure including the risks, benefits and alternatives for the proposed anesthesia with the patient or authorized representative who has indicated his/her understanding and acceptance.   Dental advisory given  Plan Discussed with: CRNA and Surgeon  Anesthesia Plan Comments:         Anesthesia Quick Evaluation

## 2013-04-28 NOTE — Progress Notes (Signed)
Patient foley had been discontinued and he has been voiding multiple times prior to my shift. He is now complaining of gas pain and wish to have something for it. Bladder scanned < 50cc to see if he is retaining urine. MG paged regarding this matter. Will continue to monitor.  Sim BoastHavy, RN

## 2013-04-28 NOTE — Transfer of Care (Addendum)
Immediate Anesthesia Transfer of Care Note  Patient: Joe Holmes  Procedure(s) Performed: Procedure(s): POSTERIOR LUMBAR FUSION 1 LEVEL LEVEL THREE/FOUR (N/A)  Patient Location: PACU  Anesthesia Type:General  Level of Consciousness: awake, alert  and oriented  Airway & Oxygen Therapy: Patient Spontanous Breathing and Patient connected to nasal cannula oxygen  Post-op Assessment: Report given to PACU RN and Post -op Vital signs reviewed and stable  Post vital signs: Reviewed and stable  Complications: No apparent anesthesia complications

## 2013-04-29 MED ORDER — BOOST / RESOURCE BREEZE PO LIQD
1.0000 | Freq: Two times a day (BID) | ORAL | Status: DC
  Administered 2013-04-30 – 2013-05-01 (×3): 1 via ORAL

## 2013-04-29 MED ORDER — SIMETHICONE 80 MG PO CHEW
80.0000 mg | CHEWABLE_TABLET | Freq: Three times a day (TID) | ORAL | Status: DC | PRN
  Administered 2013-04-29 (×2): 80 mg via ORAL
  Filled 2013-04-29 (×2): qty 1

## 2013-04-29 NOTE — Progress Notes (Signed)
Continues to take in p.o. Fluids with no distress. Bowel sounds remain active. No N&v. No rebound, does c/o mild abd discomfort when back brace in place. ,oob in hallway on multiple occasion. Denies passing flatus. On call md notified. Pt up in chair at this time.

## 2013-04-29 NOTE — Evaluation (Signed)
Physical Therapy Evaluation Patient Details Name: Joe Holmes MRN: [id number] DOB: 01/06/1966 Today's Date: 04/29/2013   History of Present Illness  Pt admitted for PLIF L3-4  Clinical Impression  Patient able to perform mobility/gait at mod I level.  Able to negotiate stairs with min hand-hold assist.  Provided patient with education on back precautions/information.  No further acute PT needs identified -PT will sign off.  Encouraged ambulation in hallway.    Follow Up Recommendations No PT follow up;Supervision - Intermittent    Equipment Recommendations  None recommended by PT    Recommendations for Other Services       Precautions / Restrictions Precautions Precautions: Back Precaution Booklet Issued: Yes (comment) Precaution Comments: Patient able to recall 3/3 back precautions Required Braces or Orthoses: Spinal Brace Spinal Brace: Lumbar corset;Applied in sitting position Restrictions Weight Bearing Restrictions: No      Mobility  Bed Mobility               General bed mobility comments: Patient able to describe proper technique for bed mobility.  States he has been "getting in and out of bed this way for a while"  Transfers Overall transfer level: Modified independent Equipment used: Rolling walker (2 wheeled)             General transfer comment: Patient uses correct hand placement and technique.  Ambulation/Gait Ambulation/Gait assistance: Modified independent (Device/Increase time) Ambulation Distance (Feet): 250 Feet Assistive device: Rolling walker (2 wheeled) Gait Pattern/deviations: Step-through pattern;Decreased stride length     General Gait Details: Patient uses RW safely.  Is able to ambulate shorter distances without AD with good balance.  Patient reports he will be using his "walking stick" for longer distances at home.  No loss of balance during gait.  Stairs Stairs: Yes Stairs assistance: Min assist Stair Management: No  rails;Step to pattern;Forwards Number of Stairs: 4 General stair comments: Instructed patient to hold wife's hand for needed support, and to use step-to pattern.  Patient able to perform with hand-hold assist (no rails at home).  Wheelchair Mobility    Modified Rankin (Stroke Patients Only)       Balance Overall balance assessment: No apparent balance deficits (not formally assessed)                                   Pertinent Vitals/Pain     Home Living Family/patient expects to be discharged to:: Private residence Living Arrangements: Spouse/significant other;Children Available Help at Discharge: Family;Available 24 hours/day (Wife available 24 hours for several days) Type of Home: House Home Access: Stairs to enter Entrance Stairs-Rails: None Entrance Stairs-Number of Steps: 3 Home Layout: One level Home Equipment: Other (comment) (High toilet; "walking stick")      Prior Function Level of Independence: Independent         Comments: 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[employer]     Hand Dominance   Dominant Hand: Right    Extremity/Trunk Assessment   Upper Extremity Assessment: Overall WFL for tasks assessed           Lower Extremity Assessment: Overall WFL for tasks assessed      Cervical / Trunk Assessment: Normal  Communication   Communication: No difficulties  Cognition Arousal/Alertness: Awake/alert Behavior During Therapy: WFL for tasks assessed/performed Overall Cognitive Status: Within Functional Limits for tasks assessed  General Comments      Exercises        Assessment/Plan    PT Assessment Patent does not need any further PT services  PT Diagnosis     PT Problem List    PT Treatment Interventions     PT Goals (Current goals can be found in the Care Plan section)      Frequency     Barriers to discharge        End of Session Equipment Utilized During Treatment: Gait  belt Activity Tolerance: Patient tolerated treatment well Patient left: in chair;with call bell/phone within reach;with family/visitor present         Time: 1610-9604 PT Time Calculation (min): 11 min   Charges:   PT Evaluation $Initial PT Evaluation Tier I: 1 Procedure     PT G CodesVena Holmes 04/29/2013, 12:54 PM Joe Holmes, [facility] Acute Rehab Services Pager [phone]

## 2013-04-29 NOTE — Progress Notes (Signed)
Dr. Cherylann BanasNuldeman called back with orders. Will continue to monitor.  Sim BoastHavy, RN

## 2013-04-29 NOTE — Evaluation (Signed)
Occupational Therapy Evaluation Patient Details Name: Joe Holmes MRN: [id number] DOB: Mar 25, 1965 Today's Date: 04/29/2013    History of Present Illness Pt admitted for PLIF L3-4   Clinical Impression   Patient evaluated by Occupational Therapy with no further acute OT needs identified. All education has been completed and the patient has no further questions. Pt demonstrates good awareness of back precautions.  He was sitting up in chair without brace upon therapist's entrance.  Instructed pt he needs to wear brace at all times when OOB.  Pt is currently able to perform BADLs at supervision to modified independent level.  See below for any follow-up Occupational Therapy or equipment needs. OT is signing off. Thank you for this referral.     Follow Up Recommendations  No OT follow up;Supervision - Intermittent    Equipment Recommendations  None recommended by OT    Recommendations for Other Services       Precautions / Restrictions Precautions Precautions: Back Precaution Comments: Pt was instructed in back precautions and able to state 2/3 precautions Required Braces or Orthoses: Spinal Brace Spinal Brace: Lumbar corset      Mobility Bed Mobility                  Transfers Overall transfer level: Modified independent Equipment used: Rolling walker (2 wheeled)                  Balance Overall balance assessment: No apparent balance deficits (not formally assessed)                                  ADL Eating/Feeding: Independent Grooming: Wash/dry hands;Wash/dry face;Oral care;Applying deodorant;Brushing hair;Set up;Supervision/safety;Standing   Upper Body Dressing : Supervision/safety;Sitting Lower Body Bathing: Supervison/ safety;Sit to/from stand;Cueing for back precautions;With adaptive equipment Lower Body Dressing: Supervision/safety;With adaptive equipment;Adhering to back precautions;Sit to/from stand Toilet Transfer: Modified  Independent;Ambulation;Comfort height toilet Toileting- Clothing Manipulation and Hygiene: Modified independent;Sit to/from stand Tub/ Engineer, structural: Walk-in shower;Modified independent;Adhering to back precautions;Ambulation Functional mobility during ADLs: Modified independent;Rolling walker General ADL Comments: Pt instructed in use and acquisition of AE.  Demonstrates good understanding of back precautions     Vision                     Perception     Praxis      Pertinent Vitals/Pain      Hand Dominance Right   Extremity/Trunk Assessment Upper Extremity Assessment Upper Extremity Assessment: Overall WFL for tasks assessed   Lower Extremity Assessment Lower Extremity Assessment: Defer to PT evaluation   Cervical / Trunk Assessment Cervical / Trunk Assessment: Normal   Communication Communication Communication: No difficulties   Cognition Arousal/Alertness: Awake/alert Behavior During Therapy: WFL for tasks assessed/performed Overall Cognitive Status: Within Functional Limits for tasks assessed                     General Comments       Exercises      Home Living Family/patient expects to be discharged to:: Private residence Living Arrangements: Spouse/significant other;Children Available Help at Discharge: Family;Available PRN/intermittently Type of Home: House             Bathroom Shower/Tub: Walk-in shower   Bathroom Toilet: Handicapped height Bathroom Accessibility: Yes How Accessible: Accessible via walker            Prior Functioning/Environment Level of Independence: Independent  Comments: 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[employer]    OT Diagnosis:     OT Problem List:     OT Treatment/Interventions:      OT Goals(Current goals can be found in the care plan section)    OT Frequency:     Barriers to D/C:            End of Session: Equipment Utilized During Treatment: Back brace;Rolling  walker  Activity Tolerance: Patient tolerated treatment well Patient left: in chair;with call bell/phone within reach   Time: [id number] OT Time Calculation (min): 26 min Charges:  OT General Charges $OT Visit: 1 Procedure OT Evaluation $Initial OT Evaluation Tier I: 1 Procedure OT Treatments $Self Care/Home Management : 8-22 mins G-Codes:    Etola Mull M 04/29/2013, 12:25 PM

## 2013-04-29 NOTE — Progress Notes (Signed)
I agree with the Student-Dietitian note and made appropriate revisions.  Katie Sarp Vernier, RD, LDN Pager #: [phone] After-Hours Pager #: [phone]  

## 2013-04-29 NOTE — Progress Notes (Signed)
INITIAL NUTRITION ASSESSMENT  DOCUMENTATION CODES Per approved criteria  -Obesity Unspecified   INTERVENTION:  Resource Breeze BID, each supplement provides 250 kcal and 9 grams protein  NUTRITION DIAGNOSIS: Inadequate oral intake related to recent surgery as evidenced by clear liquid diet.   Goal: Patient to meet >/= 90% of estimated nutrition needs  Monitor:  PO intake and supplement acceptance, weight trends, labs, I/Os  Reason for Assessment: Malnutrition Screening Tool  48 y.o. male  Admitting Dx: PLIF L3-4  ASSESSMENT: Patient is a 48 y.o. male admitted for PLIF L3-4 adjacent to previous fusion at L4-5. Onset of symptoms was a few months ago, gradually worsening since that time. The pain is rated severe, and is located at the across the lower back and radiates to right lower extremity. The pain is described as aching and occurs upon awakening. The symptoms have been progressive. Symptoms are exacerbated by exercise and standing. MRI or CT showed adjacent level stenosis L3-4 with solid fusion L4-5.  Patient reports a decreased appetite over the past month due to back pain and inactivity. Patient stated that he may have lost 6-8 lbs over the past month, however current weight is 100% of his usual body weight.   Patient is currently on a clear liquid diet due to surgery yesterday. Patient has only been able to take sips from clear liquid meal tray and has mainly been drinking water. Patient agreeable to Resource Breeze to help meet estimated needs while PO intake is limited.   Nutrition Focused Physical Exam:  Subcutaneous Fat:  Orbital Region: WNL Upper Arm Region: WNL Thoracic and Lumbar Region: N/A  Muscle:  Temple Region: WNL Clavicle Bone Region: WNL Clavicle and Acromion Bone Region: WNL Scapular Bone Region: N/A Dorsal Hand: WNL Patellar Region: N/A Anterior Thigh Region: N/A Posterior Calf Region: N/A  Edema: none noted   Height: Ht Readings from Last 1  Encounters:  04/29/13 6\' 8"  (2.032 m)  patient reported height as 6\' 1"   Weight: Wt Readings from Last 1 Encounters:  04/29/13 291 lb 0.1 oz (132 kg)    Ideal Body Weight: 184 lb (83.6 kg)  % Ideal Body Weight: 158%  Wt Readings from Last 10 Encounters:  04/29/13 291 lb 0.1 oz (132 kg)  04/29/13 291 lb 0.1 oz (132 kg)  04/22/13 291 lb 3.2 oz (132.087 kg)    Usual Body Weight: 291 lb (132 kg)  % Usual Body Weight: 100%  BMI:  38.4 kg/m^2 - Patient meets criteria for obesity class II  Estimated Nutritional Needs: Kcal: 2200-2400 Protein: 145-160 grams Fluid: 2.6-2.8 L  Skin: closed incision on back, closed system drain on back   Diet Order: Clear Liquid  EDUCATION NEEDS: -No education needs identified at this time   Intake/Output Summary (Last 24 hours) at 04/29/13 1247 Last data filed at 04/29/13 0212  Gross per 24 hour  Intake    883 ml  Output    500 ml  Net    383 ml    Last BM: 3/26   Labs:   Recent Labs Lab 04/22/13 1401  NA 141  K 4.8  CL 101  CO2 26  BUN 10  CREATININE 1.06  CALCIUM 9.9  GLUCOSE 95    CBG (last 3)  No results found for this basename: GLUCAP,  in the last 72 hours  Scheduled Meds: . celecoxib  200 mg Oral BID  . dexamethasone  4 mg Intravenous 4 times per day   Or  . dexamethasone  4 mg Oral 4 times per day  . lisinopril  10 mg Oral Daily  . senna  1 tablet Oral BID  . sodium chloride  3 mL Intravenous Q12H    Continuous Infusions: . sodium chloride    . 0.9 % NaCl with KCl 20 mEq / L 75 mL/hr (04/28/13 1720)    Past Medical History  Diagnosis Date  . Hypertension   . GERD (gastroesophageal reflux disease)     occ  . Arthritis   . Complication of anesthesia     irritable,?has panic attacks  . Sleep apnea     has cpap does not use (04/28/2013)  . Chronic lower back pain     Past Surgical History  Procedure Laterality Date  . Knee arthroscopy Right 01    meniscus  . Vasectomy  ?1993  . Nasal sinus  surgery  1990's  . Posterior lumbar fusion  2007; 04/28/2013    "L4-5; L3-4" (04/28/2013)  . Back surgery    . Tonsillectomy  1993  . Appendectomy  ~ 1971    Marlane Mingle, Dietetic Intern Pager: [phone]

## 2013-04-29 NOTE — Progress Notes (Signed)
Patient ID: Joe Holmes, male   DOB: 09/11/1965, 48 y.o.   MRN: [id number] Subjective: Patient reports no roll back or leg pain. No numbness tingling or weakness. His only complaint is abdominal discomfort. He is passing no flatus.  Objective: Vital signs in last 24 hours: Temp:  [96.9 F (36.1 C)-98.8 F (37.1 C)] 98.8 F (37.1 C) (03/27 0101) Pulse Rate:  [94-122] 108 (03/27 0538) Resp:  [9-29] 20 (03/27 0538) BP: (100-146)/(28-86) 144/85 mmHg (03/27 0538) SpO2:  [90 %-98 %] 94 % (03/27 0538) Weight:  [131.997 kg (291 lb)-132 kg (291 lb 0.1 oz)] 132 kg (291 lb 0.1 oz) (03/27 0000)  Intake/Output from previous day: 03/26 0701 - 03/27 0700 In: 2883 [P.O.:480; I.V.:2353; IV Piggyback:50] Out: 1125 [Urine:750; Drains:125; Blood:250] Intake/Output this shift: Total I/O In: 533 [P.O.:480; I.V.:3; IV Piggyback:50] Out: 125 [Drains:125]  Neurologic: Grossly normal, his belly is soft and nondistended. No tenderness to palpation of the belly. It is not hypertympanic  Lab Results: Lab Results  Component Value Date   WBC 9.9 04/22/2013   HGB 16.7 04/22/2013   HCT 46.4 04/22/2013   MCV 91.2 04/22/2013   PLT 214 04/22/2013   Lab Results  Component Value Date   INR 1.08 04/22/2013   BMET Lab Results  Component Value Date   NA 141 04/22/2013   K 4.8 04/22/2013   CL 101 04/22/2013   CO2 26 04/22/2013   GLUCOSE 95 04/22/2013   BUN 10 04/22/2013   CREATININE 1.06 04/22/2013   CALCIUM 9.9 04/22/2013    Studies/Results: Dg Lumbar Spine 2-3 Views  04/28/2013   CLINICAL DATA:  L3-L4 posterior lumbar spine fusion.  EXAM: DG C-ARM 61-120 MIN; LUMBAR SPINE - 2-3 VIEW  TECHNIQUE: AP and lateral portable views  COMPARISON:  Lumbar MRI, 04/03/2013  FINDINGS: Images show bilateral pedicle screws at L3 and L4 which are well-seated. There are interconnecting rods joining the pedicle screws. Radiolucent disc spacers maintain disc height at L3-L4 and partly maintain disc height at L5-S1. There is no  evidence of an operative complication.  IMPRESSION: Operative images from a lumbar spine fusion as described.   Electronically Signed   By: Amie Portlandavid  Ormond M.D.   On: 04/28/2013 14:08   Dg C-arm 61-120 Min  04/28/2013   CLINICAL DATA:  L3-L4 posterior lumbar spine fusion.  EXAM: DG C-ARM 61-120 MIN; LUMBAR SPINE - 2-3 VIEW  TECHNIQUE: AP and lateral portable views  COMPARISON:  Lumbar MRI, 04/03/2013  FINDINGS: Images show bilateral pedicle screws at L3 and L4 which are well-seated. There are interconnecting rods joining the pedicle screws. Radiolucent disc spacers maintain disc height at L3-L4 and partly maintain disc height at L5-S1. There is no evidence of an operative complication.  IMPRESSION: Operative images from a lumbar spine fusion as described.   Electronically Signed   By: Amie Portlandavid  Ormond M.D.   On: 04/28/2013 14:08    Assessment/Plan: Seems to be doing very well other than potential onset of ileus. I talked to him about reducing narcotics and mobilizing. He has already been out of bed. He will do clear liquids only until he is passing flatus   LOS: 1 day    JONES,DAVID S 04/29/2013, 6:48 AM

## 2013-04-29 NOTE — Progress Notes (Signed)
   CARE MANAGEMENT NOTE 04/29/2013  Patient:  Joe Holmes,Joe Holmes   Account Number:  [id number]  Date Initiated:  04/29/2013  Documentation initiated by:  Jiles CrockerHANDLER,Vanassa Penniman  Subjective/Objective Assessment:   ADMITTED FOR BACK SURGERY     Action/Plan:   CM FOLLOWING FOR DCP   Anticipated DC Date:  05/03/2013   Anticipated DC Plan:  POSSIBLY HOME/SELF CARE     DC Planning Services  CM consult         Status of service:  In process, will continue to follow Medicare Important Message given?  NA - LOS <3 / Initial given by admissions (If response is "NO", the following Medicare IM given date fields will be blank)  Per UR Regulation:  Reviewed for med. necessity/level of care/duration of stay  Comments:  3/27/2015Abelino Derrick- B Terralyn Matsumura RN,BSN,MHA [phone]

## 2013-04-29 NOTE — Progress Notes (Signed)
BOWEL SOUNDS ACTIVE TO AUSCULTATION, NO FLATUS. CLEAR LIQUIDS CONSUMED NO N& V NOTED.

## 2013-04-30 MED ORDER — BISACODYL 10 MG RE SUPP
10.0000 mg | Freq: Every day | RECTAL | Status: DC | PRN
  Administered 2013-04-30: 10 mg via RECTAL
  Filled 2013-04-30: qty 1

## 2013-04-30 MED ORDER — FLEET ENEMA 7-19 GM/118ML RE ENEM: 1.0000 | ENEMA | Freq: Every day | RECTAL | Status: DC | PRN

## 2013-04-30 MED ORDER — BISACODYL 5 MG PO TBEC
5.0000 mg | DELAYED_RELEASE_TABLET | Freq: Every day | ORAL | Status: DC | PRN
  Administered 2013-04-30: 5 mg via ORAL
  Filled 2013-04-30: qty 1

## 2013-04-30 NOTE — Progress Notes (Signed)
Per previous RN, Dulcolax and prune juice given. No BM yet.

## 2013-04-30 NOTE — Progress Notes (Signed)
Patient ID: Joe Holmes, male   DOB: 10/08/1965, 48 y.o.   MRN: [id number] Afebrile, vss No new neuro issues. Feels good regarding back and legs. Now passing gas. Will advance diet. Probably home tomorrow.

## 2013-05-01 MED ORDER — DIAZEPAM 5 MG PO TABS: 5.0000 mg | ORAL_TABLET | Freq: Four times a day (QID) | ORAL | Status: DC | PRN

## 2013-05-01 MED ORDER — BISACODYL 10 MG RE SUPP: 10.0000 mg | Freq: Every day | RECTAL | Status: DC | PRN

## 2013-05-01 MED ORDER — OXYCODONE-ACETAMINOPHEN 5-325 MG PO TABS: 1.0000 | ORAL_TABLET | ORAL | Status: DC | PRN

## 2013-05-01 MED ORDER — BISACODYL 5 MG PO TBEC: 5.0000 mg | DELAYED_RELEASE_TABLET | Freq: Every day | ORAL | Status: DC | PRN

## 2013-05-01 NOTE — Discharge Summary (Signed)
   Physician Discharge Summary  Patient ID: Joe Holmes MRN: [id number] DOB/AGE: 49/10/1965 49 y.o.  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 male electively admitted after undergoing uncomplicated L3-4 posterior lumbar interbody fusion. Postoperatively the patient was at his neurologic baseline with improvement in his leg pain. He is angulating well on postoperative day #1, but had significant issues with constipation. His drain was removed on postoperative day #2, and on postoperative day #3 he had had a bowel movement, was angulating well, tolerating diet, with pain under control with oral medication and was therefore deemed stable for discharge.  Treatments: Surgery - L3-4  posterior lumbar interbody fusion  Discharge Exam: Blood pressure 115/58, pulse 72, temperature 98 F (36.7 C), temperature source Oral, resp. rate 18, height 6\' 8"  (2.032 m), weight 132 kg (291 lb 0.1 oz), SpO2 94.00%. Awake, alert, oriented Speech fluent, appropriate CN grossly intact 5/5 BUE/BLE Wound c/d/i  Follow-up: Follow-up in Dr. Yetta BarreJones office [facility] [phone]) in 2-3 weeks  Disposition:      Medication List         acetaminophen 500 MG tablet  Commonly known as:  TYLENOL  Take 1,500 mg by mouth every 6 (six) hours as needed for mild pain.     bisacodyl 5 MG EC tablet  Commonly known as:  DULCOLAX  Take 1 tablet (5 mg total) by mouth daily as needed for moderate constipation.     bisacodyl 10 MG suppository  Commonly known as:  DULCOLAX  Place 1 suppository (10 mg total) rectally daily as needed for moderate constipation.     cyclobenzaprine 10 MG tablet  Commonly known as:  FLEXERIL  Take 10 mg by mouth 2 (two) times daily as needed for muscle spasms.     diazepam 5 MG tablet  Commonly known as:  VALIUM  Take 1 tablet (5 mg total) by mouth every 6 (six) hours as needed for muscle spasms.     lisinopril 10 MG tablet  Commonly known as:  PRINIVIL,ZESTRIL  Take 10 mg by mouth daily.     oxyCODONE-acetaminophen 5-325 MG per tablet  Commonly known as:  PERCOCET/ROXICET  Take 1-2 tablets by mouth every 4 (four) hours as needed for moderate pain.         SignedLisbeth Renshaw: Jesi Jurgens, C 05/01/2013, 9:19 AM

## 2014-10-30 IMAGING — RF DG LUMBAR SPINE 2-3V
1 series · 2 of 2 positions shown · non-contrast
Comparison: Lumbar MRI, 04/03/2013

CLINICAL DATA: L3-L4 posterior lumbar spine fusion.

EXAM:
DG C-ARM 61-120 MIN; LUMBAR SPINE - 2-3 VIEW
TECHNIQUE: AP and lateral portable views

[Series 1: run · 2 of 2 slices shown]
[im 1/2]
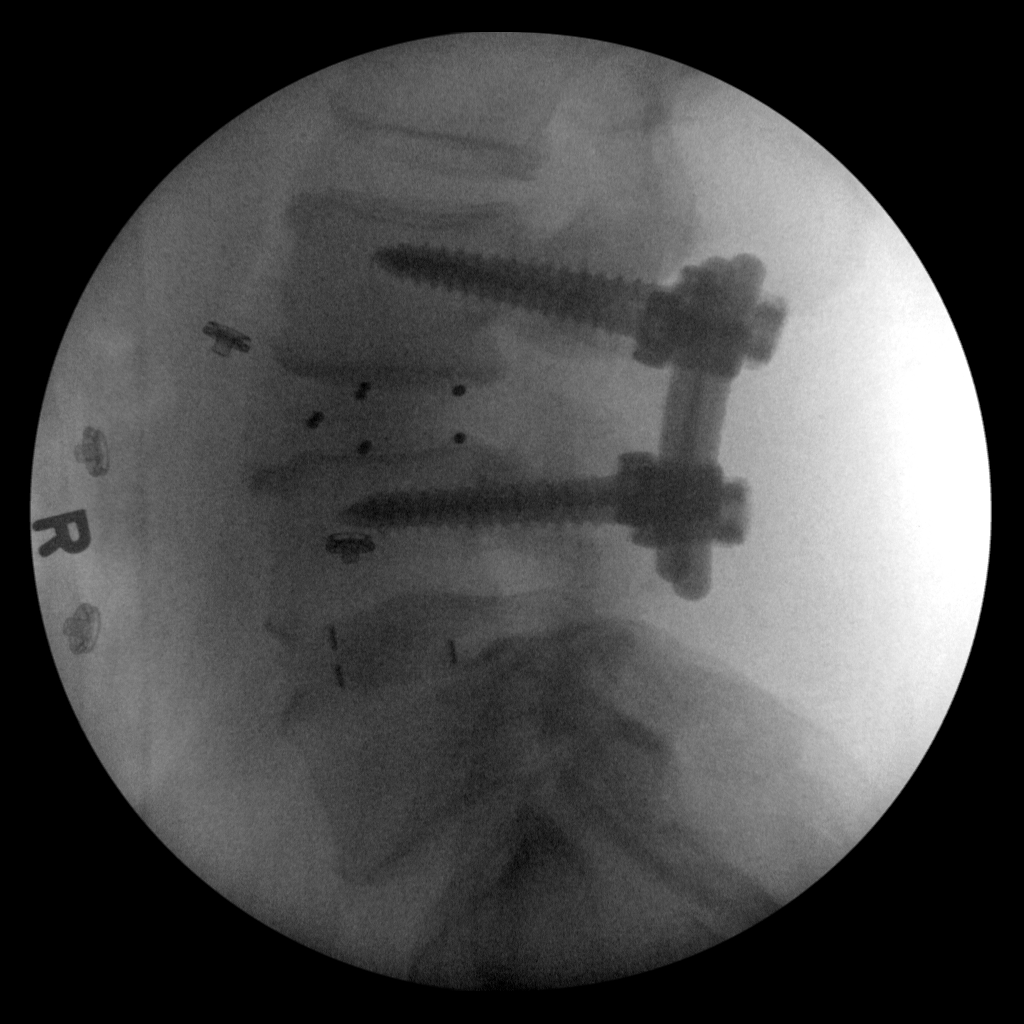
[im 2/2]
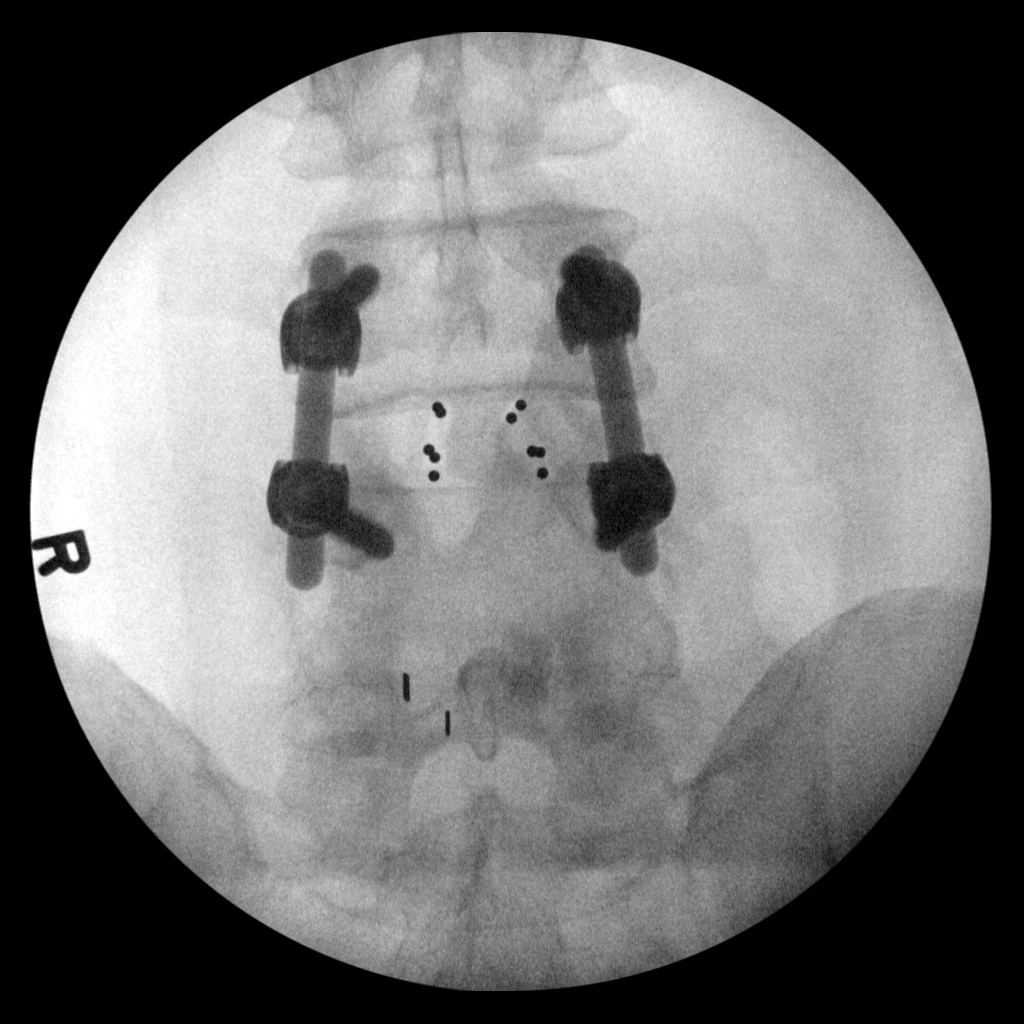

[2 of 2 positions shown; findings below may reference images not displayed]

FINDINGS: Images show bilateral pedicle screws at L3 and L4 which are
well-seated. There are interconnecting rods joining the pedicle
screws. Radiolucent disc spacers maintain disc height at L3-L4 and
partly maintain disc height at L5-S1. There is no evidence of an
operative complication.
IMPRESSION: Operative images from a lumbar spine fusion as described.

## 2015-05-25 DIAGNOSIS — M545 Low back pain: Secondary | ICD-10-CM | POA: Diagnosis not present

## 2015-08-10 DIAGNOSIS — I10 Essential (primary) hypertension: Secondary | ICD-10-CM | POA: Diagnosis not present

## 2015-08-10 DIAGNOSIS — E291 Testicular hypofunction: Secondary | ICD-10-CM | POA: Diagnosis not present

## 2015-08-10 DIAGNOSIS — G4733 Obstructive sleep apnea (adult) (pediatric): Secondary | ICD-10-CM | POA: Diagnosis not present

## 2015-08-10 DIAGNOSIS — Z0000 Encounter for general adult medical examination without abnormal findings: Secondary | ICD-10-CM | POA: Diagnosis not present

## 2015-08-10 DIAGNOSIS — G894 Chronic pain syndrome: Secondary | ICD-10-CM | POA: Diagnosis not present

## 2015-08-10 DIAGNOSIS — Z1389 Encounter for screening for other disorder: Secondary | ICD-10-CM | POA: Diagnosis not present

## 2015-08-20 DIAGNOSIS — K219 Gastro-esophageal reflux disease without esophagitis: Secondary | ICD-10-CM | POA: Diagnosis not present

## 2015-08-28 DIAGNOSIS — I10 Essential (primary) hypertension: Secondary | ICD-10-CM | POA: Diagnosis not present

## 2015-08-28 DIAGNOSIS — D126 Benign neoplasm of colon, unspecified: Secondary | ICD-10-CM | POA: Diagnosis not present

## 2015-08-28 DIAGNOSIS — K635 Polyp of colon: Secondary | ICD-10-CM | POA: Diagnosis not present

## 2015-08-28 DIAGNOSIS — D124 Benign neoplasm of descending colon: Secondary | ICD-10-CM | POA: Diagnosis not present

## 2015-08-28 DIAGNOSIS — F17200 Nicotine dependence, unspecified, uncomplicated: Secondary | ICD-10-CM | POA: Diagnosis not present

## 2015-08-28 DIAGNOSIS — Z1211 Encounter for screening for malignant neoplasm of colon: Secondary | ICD-10-CM | POA: Diagnosis not present

## 2015-08-28 DIAGNOSIS — D122 Benign neoplasm of ascending colon: Secondary | ICD-10-CM | POA: Diagnosis not present

## 2016-02-11 DIAGNOSIS — I10 Essential (primary) hypertension: Secondary | ICD-10-CM | POA: Diagnosis not present

## 2016-02-11 DIAGNOSIS — Z5181 Encounter for therapeutic drug level monitoring: Secondary | ICD-10-CM | POA: Diagnosis not present

## 2016-02-11 DIAGNOSIS — G894 Chronic pain syndrome: Secondary | ICD-10-CM | POA: Diagnosis not present

## 2016-02-11 DIAGNOSIS — Z79899 Other long term (current) drug therapy: Secondary | ICD-10-CM | POA: Diagnosis not present

## 2016-08-11 DIAGNOSIS — G894 Chronic pain syndrome: Secondary | ICD-10-CM | POA: Diagnosis not present

## 2016-08-11 DIAGNOSIS — B379 Candidiasis, unspecified: Secondary | ICD-10-CM | POA: Diagnosis not present

## 2016-08-11 DIAGNOSIS — I10 Essential (primary) hypertension: Secondary | ICD-10-CM | POA: Diagnosis not present

## 2016-08-20 DIAGNOSIS — L918 Other hypertrophic disorders of the skin: Secondary | ICD-10-CM | POA: Diagnosis not present

## 2016-08-20 DIAGNOSIS — L814 Other melanin hyperpigmentation: Secondary | ICD-10-CM | POA: Diagnosis not present

## 2017-01-07 DIAGNOSIS — Z1389 Encounter for screening for other disorder: Secondary | ICD-10-CM | POA: Diagnosis not present

## 2017-01-07 DIAGNOSIS — I10 Essential (primary) hypertension: Secondary | ICD-10-CM | POA: Diagnosis not present

## 2017-01-07 DIAGNOSIS — Z0000 Encounter for general adult medical examination without abnormal findings: Secondary | ICD-10-CM | POA: Diagnosis not present

## 2017-01-07 DIAGNOSIS — G4733 Obstructive sleep apnea (adult) (pediatric): Secondary | ICD-10-CM | POA: Diagnosis not present

## 2017-01-07 DIAGNOSIS — E291 Testicular hypofunction: Secondary | ICD-10-CM | POA: Diagnosis not present

## 2017-01-28 DIAGNOSIS — J209 Acute bronchitis, unspecified: Secondary | ICD-10-CM | POA: Diagnosis not present

## 2017-02-11 DIAGNOSIS — Z23 Encounter for immunization: Secondary | ICD-10-CM | POA: Diagnosis not present

## 2017-02-11 DIAGNOSIS — E669 Obesity, unspecified: Secondary | ICD-10-CM | POA: Diagnosis not present

## 2017-02-11 DIAGNOSIS — Z6841 Body Mass Index (BMI) 40.0 and over, adult: Secondary | ICD-10-CM | POA: Diagnosis not present

## 2017-02-11 DIAGNOSIS — R748 Abnormal levels of other serum enzymes: Secondary | ICD-10-CM | POA: Diagnosis not present

## 2017-02-11 DIAGNOSIS — R749 Abnormal serum enzyme level, unspecified: Secondary | ICD-10-CM | POA: Diagnosis not present

## 2017-04-15 DIAGNOSIS — R002 Palpitations: Secondary | ICD-10-CM | POA: Diagnosis not present

## 2017-04-15 DIAGNOSIS — E669 Obesity, unspecified: Secondary | ICD-10-CM | POA: Diagnosis not present

## 2017-04-15 DIAGNOSIS — Z6838 Body mass index (BMI) 38.0-38.9, adult: Secondary | ICD-10-CM | POA: Diagnosis not present

## 2017-06-17 DIAGNOSIS — Z6837 Body mass index (BMI) 37.0-37.9, adult: Secondary | ICD-10-CM | POA: Diagnosis not present

## 2017-06-17 DIAGNOSIS — E669 Obesity, unspecified: Secondary | ICD-10-CM | POA: Diagnosis not present

## 2018-01-13 DIAGNOSIS — E291 Testicular hypofunction: Secondary | ICD-10-CM | POA: Diagnosis not present

## 2018-01-13 DIAGNOSIS — G4733 Obstructive sleep apnea (adult) (pediatric): Secondary | ICD-10-CM | POA: Diagnosis not present

## 2018-01-13 DIAGNOSIS — Z0000 Encounter for general adult medical examination without abnormal findings: Secondary | ICD-10-CM | POA: Diagnosis not present

## 2018-01-13 DIAGNOSIS — Z1331 Encounter for screening for depression: Secondary | ICD-10-CM | POA: Diagnosis not present

## 2018-01-13 DIAGNOSIS — I10 Essential (primary) hypertension: Secondary | ICD-10-CM | POA: Diagnosis not present

## 2018-01-25 DIAGNOSIS — R197 Diarrhea, unspecified: Secondary | ICD-10-CM | POA: Diagnosis not present

## 2018-04-28 DIAGNOSIS — I10 Essential (primary) hypertension: Secondary | ICD-10-CM | POA: Diagnosis not present

## 2018-07-23 ENCOUNTER — Emergency Department (HOSPITAL_COMMUNITY): Payer: Federal, State, Local not specified - PPO

## 2018-07-23 ENCOUNTER — Emergency Department (HOSPITAL_COMMUNITY)
Admission: EM | Admit: 2018-07-23 | Discharge: 2018-07-24 | Disposition: A | Discharge status: Home / Self Care | Payer: Federal, State, Local not specified - PPO | Attending: Emergency Medicine | Admitting: Emergency Medicine

## 2018-07-23 ENCOUNTER — Other Ambulatory Visit: Payer: Self-pay

## 2018-07-23 ENCOUNTER — Encounter (HOSPITAL_COMMUNITY): Payer: Self-pay

## 2018-07-23 DIAGNOSIS — R0789 Other chest pain: Secondary | ICD-10-CM | POA: Diagnosis not present

## 2018-07-23 DIAGNOSIS — R079 Chest pain, unspecified: Secondary | ICD-10-CM | POA: Diagnosis not present

## 2018-07-23 DIAGNOSIS — I10 Essential (primary) hypertension: Secondary | ICD-10-CM | POA: Insufficient documentation

## 2018-07-23 DIAGNOSIS — K219 Gastro-esophageal reflux disease without esophagitis: Secondary | ICD-10-CM | POA: Diagnosis not present

## 2018-07-23 DIAGNOSIS — Z79899 Other long term (current) drug therapy: Secondary | ICD-10-CM | POA: Insufficient documentation

## 2018-07-23 DIAGNOSIS — Z87891 Personal history of nicotine dependence: Secondary | ICD-10-CM | POA: Insufficient documentation

## 2018-07-23 LAB — BASIC METABOLIC PANEL
Anion gap: 9 (ref 5–15)
BUN: 13 mg/dL (ref 6–20)
CO2: 26 mmol/L (ref 22–32)
Calcium: 9.5 mg/dL (ref 8.9–10.3)
Chloride: 104 mmol/L (ref 98–111)
Creatinine, Ser: 1.27 mg/dL — ABNORMAL HIGH (ref 0.61–1.24)
GFR calc Af Amer: >60 mL/min (ref >60)
GFR calc non Af Amer: >60 mL/min (ref >60)
Glucose, Bld: 85 mg/dL (ref 70–99)
Potassium: 3.9 mmol/L (ref 3.5–5.1)
Sodium: 139 mmol/L (ref 135–145)

## 2018-07-23 LAB — CBC
HCT: 45.3 % (ref 39.0–52.0)
Hemoglobin: 15.5 g/dL (ref 13.0–17.0)
MCH: 32.3 pg (ref 26.0–34.0)
MCHC: 34.2 g/dL (ref 30.0–36.0)
MCV: 94.4 fL (ref 80.0–100.0)
Platelets: 222 10*3/uL (ref 150–400)
RBC: 4.8 MIL/uL (ref 4.22–5.81)
RDW: 12.4 % (ref 11.5–15.5)
WBC: 11.2 10*3/uL — ABNORMAL HIGH (ref 4.0–10.5)
nRBC: 0 % (ref 0.0–0.2)

## 2018-07-23 LAB — TROPONIN I: Troponin I: <0.03 ng/mL (ref <0.03)

## 2018-07-23 MED ORDER — SODIUM CHLORIDE 0.9% FLUSH: 3.0000 mL | Freq: Once | INTRAVENOUS | Status: DC

## 2018-07-23 MED ORDER — ALUM & MAG HYDROXIDE-SIMETH 200-200-20 MG/5ML PO SUSP
30.0000 mL | Freq: Once | ORAL | Status: AC
  Administered 2018-07-23: 30 mL via ORAL
  Filled 2018-07-23: qty 30

## 2018-07-23 NOTE — ED Provider Notes (Signed)
[facility]   CSN: [id number] Arrival date & time: 07/23/18  2034    History   Chief Complaint Chief Complaint  Patient presents with  . Chest Pain    HPI Joe Holmes is a 53 y.o. male.  HPI: A 53 year old patient with a history of hypertension and hypercholesterolemia presents for evaluation of chest pain. Initial onset of pain was less than one hour ago. The patient's chest pain is well-localized, is sharp and is not worse with exertion. The patient's chest pain is not middle- or left-sided, is not described as heaviness/pressure/tightness and does not radiate to the arms/jaw/neck. The patient does not complain of nausea and denies diaphoresis. The patient has no history of stroke, has no history of peripheral artery disease, has not smoked in the past 90 days, denies any history of treated diabetes, has no relevant family history of coronary artery disease (first degree relative at less than age 54) and does not have an elevated BMI (>=30).  HPI Pt started having heartburn sx last night.  He took some nexiu. Pt started having sx again this morning.  He noticed when he ate something it felt a sensation in his chest.  It lasted a minute or so.  No burning. He was able to do yard work later in the day.  He felt fine doing that.  He noticed another episode when he tried eating again tonight.  He has had some discomfort in his right cheset.   No cough.  No shortness of breath.  No leg swelling.   Past Medical History:  Diagnosis Date  . Arthritis   . Chronic lower back pain   . Complication of anesthesia    irritable,?has panic attacks  . GERD (gastroesophageal reflux disease)    occ  . Hypertension   . Sleep apnea    has cpap does not use (04/28/2013)    Patient Active Problem List   Diagnosis Date Noted  . S/P lumbar fusion 04/28/2013    Past Surgical History:  Procedure Laterality Date  . APPENDECTOMY  ~ 1971  . BACK  SURGERY    . KNEE ARTHROSCOPY Right 01   meniscus  . NASAL SINUS SURGERY  1990's  . POSTERIOR LUMBAR FUSION  2007; 04/28/2013   "L4-5; L3-4" (04/28/2013)  . TONSILLECTOMY  1993  . VASECTOMY  ?1993        Home Medications    Prior to Admission medications   Medication Sig Start Date End Date Taking? Authorizing Provider  Ascorbic Acid (VITAMIN C PO) Take 1 tablet by mouth daily.   Yes [provider]  lisinopril (PRINIVIL,ZESTRIL) 10 MG tablet Take 10 mg by mouth daily.   Yes [provider]  bisacodyl (DULCOLAX) 10 MG suppository Place 1 suppository (10 mg total) rectally daily as needed for moderate constipation. Patient not taking: Reported on 07/23/2018 05/01/13   Lisbeth RenshawNundkumar, Neelesh, MD  bisacodyl (DULCOLAX) 5 MG EC tablet Take 1 tablet (5 mg total) by mouth daily as needed for moderate constipation. Patient not taking: Reported on 07/23/2018 05/01/13   Lisbeth RenshawNundkumar, Neelesh, MD  diazepam (VALIUM) 5 MG tablet Take 1 tablet (5 mg total) by mouth every 6 (six) hours as needed for muscle spasms. Patient not taking: Reported on 07/23/2018 05/01/13   Lisbeth RenshawNundkumar, Neelesh, MD  oxyCODONE-acetaminophen (PERCOCET/ROXICET) 5-325 MG per tablet Take 1-2 tablets by mouth every 4 (four) hours as needed for moderate pain. Patient not taking: Reported on 07/23/2018 05/01/13  Consuella Lose, MD    Family History No family history on file.  Social History Social History   Tobacco Use  . Smoking status: Former Smoker    Packs/day: 0.50    Years: 2.00    Pack years: 1.00    Types: Cigarettes    Quit date: 04/23/1983    Years since quitting: 35.2  . Smokeless tobacco: Current User    Types: Snuff  Substance Use Topics  . Alcohol use: Yes    Alcohol/week: 105.0 standard drinks    Types: 105 Shots of liquor per week    Comment: 04/28/2013 "probably 15 shots/day over the last month; drink liquor in the winter; beer in the summer"  . Drug use: No     Allergies   Codeine    Review of Systems Review of Systems  Gastrointestinal: Negative for abdominal pain.  All other systems reviewed and are negative.    Physical Exam Updated Vital Signs BP (!) 125/103   Pulse 91   Temp 98.4 F (36.9 C) (Oral)   Resp (!) 22   Ht 1.829 m (6')   Wt 133.8 kg   SpO2 94%   BMI 40.01 kg/m   Physical Exam Vitals signs and nursing note reviewed.  Constitutional:      General: He is not in acute distress.    Appearance: He is well-developed.  HENT:     Head: Normocephalic and atraumatic.     Right Ear: External ear normal.     Left Ear: External ear normal.  Eyes:     General: No scleral icterus.       Right eye: No discharge.        Left eye: No discharge.     Conjunctiva/sclera: Conjunctivae normal.  Neck:     Musculoskeletal: Neck supple.     Trachea: No tracheal deviation.  Cardiovascular:     Rate and Rhythm: Normal rate and regular rhythm.  Pulmonary:     Effort: Pulmonary effort is normal. No respiratory distress.     Breath sounds: Normal breath sounds. No stridor. No wheezing or rales.  Abdominal:     General: Bowel sounds are normal. There is no distension.     Palpations: Abdomen is soft.     Tenderness: There is no abdominal tenderness. There is no guarding or rebound.  Musculoskeletal:        General: No tenderness.  Skin:    General: Skin is warm and dry.     Findings: No rash.  Neurological:     Mental Status: He is alert.     Cranial Nerves: No cranial nerve deficit (no facial droop, extraocular movements intact, no slurred speech).     Sensory: No sensory deficit.     Motor: No abnormal muscle tone or seizure activity.     Coordination: Coordination normal.      ED Treatments / Results  Labs (all labs ordered are listed, but only abnormal results are displayed) Labs Reviewed  BASIC METABOLIC PANEL - Abnormal; Notable for the following components:      Result Value   Creatinine, Ser 1.27 (*)    All other components within  normal limits  CBC - Abnormal; Notable for the following components:   WBC 11.2 (*)    All other components within normal limits  TROPONIN I  TROPONIN I    EKG EKG Interpretation  Date/Time:  Friday July 23 2018 20:39:43 EDT Ventricular Rate:  109 PR Interval:  128 QRS Duration: 86  QT Interval:  312 QTC Calculation: 420 R Axis:   25 Text Interpretation:  Sinus tachycardia Otherwise normal ECG No significant change since last tracing Confirmed by Linwood DibblesKnapp, Tiwana Chavis [phone]([id number]) on 07/23/2018 9:52:42 PM   Radiology Dg Chest 2 View  Result Date: 07/23/2018 CLINICAL DATA:  Chest pain EXAM: CHEST - 2 VIEW COMPARISON:  03/25/2013 FINDINGS: The heart size and mediastinal contours are within normal limits. Both lungs are clear. Degenerative changes of the spine. IMPRESSION: No active cardiopulmonary disease. Electronically Signed   By: Jasmine PangKim  Fujinaga M.D.   On: 07/23/2018 21:04    Procedures Procedures (including critical care time)  Medications Ordered in ED Medications  sodium chloride flush (NS) 0.9 % injection 3 mL (has no administration in time range)  alum & mag hydroxide-simeth (MAALOX/MYLANTA) 200-200-20 MG/5ML suspension 30 mL (30 mLs Oral Given 07/23/18 2155)     Initial Impression / Assessment and Plan / ED Course  I have reviewed the triage vital signs and the nursing notes.  Pertinent labs & imaging results that were available during my care of the patient were reviewed by me and considered in my medical decision making (see chart for details).     HEAR Score: 2  Pt presented to the ED with chest pain.  Sx atypical for ACS.  Heart score low risk.  Initial trop normal.  Plan on delta trop.  If negative, will treat for gerd, esophagitis.  Dr Adela LankFloyd will follow up on delta trop  Final Clinical Impressions(s) / ED Diagnoses   Final diagnoses:  Chest pain, unspecified type  Gastroesophageal reflux disease, esophagitis presence not specified    ED Discharge Orders    None        Linwood DibblesKnapp, Michai Dieppa, MD 07/23/18 2326

## 2018-07-23 NOTE — ED Triage Notes (Signed)
Pt reports waking up around 230 this morning with heartburn, took some nexium and it improved. Pt then had lunch today and felt the chest burning again and felt flushed. Pt then had another episode after dinner today. Pt a.o, nad noted.

## 2018-07-24 LAB — TROPONIN I: Troponin I: <0.03 ng/mL (ref <0.03)

## 2018-07-24 MED ORDER — SUCRALFATE 1 GM/10ML PO SUSP: 1.0000 g | Freq: Three times a day (TID) | ORAL | 0 refills | Status: DC

## 2018-07-24 NOTE — ED Provider Notes (Signed)
I received the patient in signout from Dr. Tomi Bamberger, briefly the patient is a 53 year old male with a history of reflux that is been having chest pain that is worse with eating.  No exertional symptoms is able to mow the lawn without difficulty.  Initial troponin is negative plan is for a delta and reassess.  Delta troponin is negative.  Patient continues to feel well.  PCP follow-up.   Deno Etienne, DO 07/24/18 [phone]

## 2018-07-24 NOTE — Discharge Instructions (Signed)
Try to avoid things that may make this worse, most commonly these are spicy foods tomato based products fatty foods chocolate and peppermint.  Alcohol and tobacco can also make this worse.  Return to the emergency department for sudden worsening pain fever or inability to eat or drink.  

## 2018-09-09 DIAGNOSIS — S61213A Laceration without foreign body of left middle finger without damage to nail, initial encounter: Secondary | ICD-10-CM | POA: Diagnosis not present

## 2018-09-09 DIAGNOSIS — Z23 Encounter for immunization: Secondary | ICD-10-CM | POA: Diagnosis not present

## 2019-01-17 DIAGNOSIS — Z0000 Encounter for general adult medical examination without abnormal findings: Secondary | ICD-10-CM | POA: Diagnosis not present

## 2019-01-17 DIAGNOSIS — Z13220 Encounter for screening for lipoid disorders: Secondary | ICD-10-CM | POA: Diagnosis not present

## 2019-01-17 DIAGNOSIS — E291 Testicular hypofunction: Secondary | ICD-10-CM | POA: Diagnosis not present

## 2019-01-17 DIAGNOSIS — Z1331 Encounter for screening for depression: Secondary | ICD-10-CM | POA: Diagnosis not present

## 2019-01-17 DIAGNOSIS — I10 Essential (primary) hypertension: Secondary | ICD-10-CM | POA: Diagnosis not present

## 2019-01-17 DIAGNOSIS — G4733 Obstructive sleep apnea (adult) (pediatric): Secondary | ICD-10-CM | POA: Diagnosis not present

## 2019-06-30 DIAGNOSIS — M545 Low back pain: Secondary | ICD-10-CM | POA: Diagnosis not present

## 2019-06-30 DIAGNOSIS — J302 Other seasonal allergic rhinitis: Secondary | ICD-10-CM | POA: Diagnosis not present

## 2019-08-11 ENCOUNTER — Telehealth: Payer: Self-pay | Admitting: Physician Assistant

## 2019-08-11 DIAGNOSIS — R5382 Chronic fatigue, unspecified: Secondary | ICD-10-CM | POA: Diagnosis not present

## 2019-08-11 DIAGNOSIS — G4730 Sleep apnea, unspecified: Secondary | ICD-10-CM | POA: Insufficient documentation

## 2019-08-11 DIAGNOSIS — R519 Headache, unspecified: Secondary | ICD-10-CM | POA: Diagnosis not present

## 2019-08-11 DIAGNOSIS — I10 Essential (primary) hypertension: Secondary | ICD-10-CM | POA: Insufficient documentation

## 2019-08-11 DIAGNOSIS — Z20828 Contact with and (suspected) exposure to other viral communicable diseases: Secondary | ICD-10-CM | POA: Diagnosis not present

## 2019-08-11 DIAGNOSIS — G4733 Obstructive sleep apnea (adult) (pediatric): Secondary | ICD-10-CM | POA: Insufficient documentation

## 2019-08-11 NOTE — Telephone Encounter (Signed)
Called to discuss with Roanna Epley about Covid symptoms and the use of bamlanivimab/etesevimab or casirivimab/imdevimab, a monoclonal antibody infusion for those with mild to moderate Covid symptoms and at a high risk of hospitalization.     Pt is qualified for this infusion at the monoclonal antibody infusion center due to co-morbid conditions and/or a member of an at-risk group, however declines infusion at this time. Symptoms tier reviewed as well as criteria for ending isolation.  Symptoms reviewed that would warrant ED/Hospital evaluation. Preventative practices reviewed. Patient verbalized understanding. Patient advised to call back if he decides that he does want to get infusion. Callback number to the infusion center given. Patient advised to go to Urgent care or ED with severe symptoms. Last date pt would be eligible for infusion is 08/18/19.   Has our hotline number in case he changes.    Patient Active Problem List   Diagnosis Date Noted  . Hypertension   . Sleep apnea   . S/P lumbar fusion 04/28/2013    Cline Crock PA-C

## 2020-07-16 DIAGNOSIS — M545 Low back pain, unspecified: Secondary | ICD-10-CM | POA: Diagnosis not present

## 2020-07-16 DIAGNOSIS — I10 Essential (primary) hypertension: Secondary | ICD-10-CM | POA: Diagnosis not present

## 2020-08-29 DIAGNOSIS — Z7902 Long term (current) use of antithrombotics/antiplatelets: Secondary | ICD-10-CM | POA: Diagnosis not present

## 2020-08-29 DIAGNOSIS — K219 Gastro-esophageal reflux disease without esophagitis: Secondary | ICD-10-CM | POA: Diagnosis not present

## 2020-09-14 DIAGNOSIS — Z86010 Personal history of colonic polyps: Secondary | ICD-10-CM | POA: Diagnosis not present

## 2020-10-18 DIAGNOSIS — K219 Gastro-esophageal reflux disease without esophagitis: Secondary | ICD-10-CM | POA: Diagnosis not present

## 2020-10-18 DIAGNOSIS — G894 Chronic pain syndrome: Secondary | ICD-10-CM | POA: Diagnosis not present

## 2020-10-18 DIAGNOSIS — I10 Essential (primary) hypertension: Secondary | ICD-10-CM | POA: Diagnosis not present

## 2020-10-18 DIAGNOSIS — Z1331 Encounter for screening for depression: Secondary | ICD-10-CM | POA: Diagnosis not present

## 2020-10-18 DIAGNOSIS — Z6839 Body mass index (BMI) 39.0-39.9, adult: Secondary | ICD-10-CM | POA: Diagnosis not present

## 2020-10-18 DIAGNOSIS — E291 Testicular hypofunction: Secondary | ICD-10-CM | POA: Diagnosis not present

## 2020-10-18 DIAGNOSIS — Z0000 Encounter for general adult medical examination without abnormal findings: Secondary | ICD-10-CM | POA: Diagnosis not present

## 2020-10-23 DIAGNOSIS — E291 Testicular hypofunction: Secondary | ICD-10-CM | POA: Diagnosis not present

## 2020-10-23 DIAGNOSIS — N486 Induration penis plastica: Secondary | ICD-10-CM | POA: Diagnosis not present

## 2021-01-18 DIAGNOSIS — I10 Essential (primary) hypertension: Secondary | ICD-10-CM | POA: Diagnosis not present

## 2021-04-23 DIAGNOSIS — E291 Testicular hypofunction: Secondary | ICD-10-CM | POA: Diagnosis not present

## 2021-05-31 DIAGNOSIS — E291 Testicular hypofunction: Secondary | ICD-10-CM | POA: Diagnosis not present

## 2021-08-23 DIAGNOSIS — I10 Essential (primary) hypertension: Secondary | ICD-10-CM | POA: Diagnosis not present

## 2021-08-23 DIAGNOSIS — E291 Testicular hypofunction: Secondary | ICD-10-CM | POA: Diagnosis not present

## 2021-12-23 ENCOUNTER — Encounter: Payer: Self-pay | Admitting: Internal Medicine

## 2021-12-23 ENCOUNTER — Ambulatory Visit (INDEPENDENT_AMBULATORY_CARE_PROVIDER_SITE_OTHER): Payer: Federal, State, Local not specified - PPO | Admitting: Internal Medicine

## 2021-12-23 VITALS — BP 114/80 | HR 103 | Temp 98.8°F | Resp 16 | Ht 72.0 in | Wt 307.1 lb

## 2021-12-23 DIAGNOSIS — K219 Gastro-esophageal reflux disease without esophagitis: Secondary | ICD-10-CM

## 2021-12-23 DIAGNOSIS — G8929 Other chronic pain: Secondary | ICD-10-CM

## 2021-12-23 DIAGNOSIS — I10 Essential (primary) hypertension: Secondary | ICD-10-CM

## 2021-12-23 DIAGNOSIS — Z6841 Body Mass Index (BMI) 40.0 and over, adult: Secondary | ICD-10-CM | POA: Diagnosis not present

## 2021-12-23 DIAGNOSIS — J0100 Acute maxillary sinusitis, unspecified: Secondary | ICD-10-CM

## 2021-12-23 DIAGNOSIS — Z0000 Encounter for general adult medical examination without abnormal findings: Secondary | ICD-10-CM

## 2021-12-23 DIAGNOSIS — E291 Testicular hypofunction: Secondary | ICD-10-CM | POA: Diagnosis not present

## 2021-12-23 DIAGNOSIS — G4733 Obstructive sleep apnea (adult) (pediatric): Secondary | ICD-10-CM

## 2021-12-23 DIAGNOSIS — J302 Other seasonal allergic rhinitis: Secondary | ICD-10-CM

## 2021-12-23 DIAGNOSIS — M545 Low back pain, unspecified: Secondary | ICD-10-CM | POA: Insufficient documentation

## 2021-12-23 MED ORDER — AMOXICILLIN 500 MG PO TABS: 500.0000 mg | ORAL_TABLET | Freq: Two times a day (BID) | ORAL | 0 refills | Status: AC

## 2021-12-23 MED ORDER — FLUTICASONE PROPIONATE 50 MCG/ACT NA SUSP: 1.0000 | Freq: Two times a day (BID) | NASAL | 2 refills | Status: AC

## 2021-12-23 NOTE — Assessment & Plan Note (Signed)
We checked his testosterone levels on his last visit and they were appropriate.  We will continue on testosterone.

## 2021-12-23 NOTE — Assessment & Plan Note (Signed)
His BP is controlled.  We will continue with lisinopril.

## 2021-12-23 NOTE — Assessment & Plan Note (Signed)
We will place him on a course of amoxicillin and flonase.  Continue supportive care.

## 2021-12-23 NOTE — Progress Notes (Signed)
Office Visit  Subjective   Patient ID: Joe Holmes   DOB: 08/20/1965   Age: 56 y.o.   MRN: [id number]   Chief Complaint Chief Complaint  Patient presents with   Annual Exam     History of Present Illness Joe Holmes is a 56 year old Caucasian/White male who presents for his annual health maintenance exam.  This patient's past medical history Hypertension, Hypogonadism, Male, and Obstructive Sleep Apnea.   His last eye exam was 3 years ago and he states his vision is doing well. He had a repeat colonoscopy on 09/14/20 which was normal. They want to repeat this in 5 years. His previous colonoscopy was done in 08/2015 and this showed polyps. He denies any problems with urination. He is exercising regularly by walking and yard work. He does not get yearly flu vaccines. He has not had any of the COVID vaccines. He did contract COVID in 08/2019 but denies any long term sequalae. He denies any depression or anxiety. There is no family history of CAD or stroke. He is on an ASA 81mg  daily.   Joe Holmes returns today also for followup of his low testosterone/hypogonadism.  On his last visit, his testosterone level was in the 500's and I continued his testosterone from 293mcg q 2 weeks to 269mcg q 2 weeks.  He has been on/off testosterone replacement for years due to complaints of fatigue and decreased libido.   His dose has helped with his fatigue and libido.   He does his own shots at home.  The patient is a 56 year old Caucasian/White male who presents for a follow-up evaluation of hypertension. The patient has not been checking his blood pressure at home. The patient's current medications include: lisinopril 10mg  daily. The patient has been tolerating his medications well. The patient denies any headache, visual changes, dizziness, lightheadness, chest pain, shortness of breath, weakness/numbness, and edema.  He reports there have been no other symptoms noted.   The patient also has a history of  chronic low back pain where he was placed on disability in 2015.  Over this past year, there is no change in his chronic pain.  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  He states the numbness and weakness in his bilateral feet is unchanged since his last visit.  The patient was seeing Dr. Ronnald Ramp in neurosurgery where he had surgery done in 04/2013 with fusion of L3-L4.  He was on vicodin but stopped it in 2020.  He states that he did not have the same outcome as his previous L4-L5 fusion done in 2007 and he is still having pain.  He has low back pain that is continuous dull aching pain with radiation down the lateral sides of his bilateral thighs and radiates down his feet to the lateral side of his feet.  Dr. Ronnald Ramp was writing him for percocet 5/325mg  1-2 tabs po q 6 hr prn but Joe Holmes stopped all his pain meds.  He also was using alcohol for his pain but he has not had any alcohol use since 2020.  He had some elevated liver enzymes in the past but again, he quit drinking and they went back to normal.  The patient has a history of low back pain where he fell off a back of a truck in 2007 and had a crush injury of his back.  He had ESI's in the past and  eventually underwent fusion L4-L5 in 2007.  He has had low back pain since then.  He states he will get numbness if he sits on the toilet but no loss of bowel/bladder function.  Ever since his surgery in 04/2013, he has had weakness in both legs.  He has not had PT and has not had any falls.  He worked for [employer] and since his surgery he has lost his job secondary to him having weakness in his legs and he is unable to climb power poles now.   He used to take tylenol for pain but now he just rests and try to cope with his pain.   Joe Holmes also has a history of seasonal allergies.  He states his symptoms effect him throughout the year.    He will usually have symptoms of congestion with  headache and sometimes sneezing with runny clear nose.  He does take allegra which helps.  This past week, he has some congestion with green nasal discharge with cough productive of green sputum.  He denies any f/c, headaches, myalgias, SOB, wheezing, chest congestion, nausea, vomiting or diarrhea.  He also has a history of reflux but this is controlled on Nexium.     He does have OSA but he does not wear a CPAP.  He does have a CPAP at home but he is not able to tolerate this.           Past Medical History Past Medical History:  Diagnosis Date   Arthritis    Chronic lower back pain    Complication of anesthesia    irritable,?has panic attacks   GERD (gastroesophageal reflux disease)    occ   Hypertension    Hypogonadism in male    OSA (obstructive sleep apnea)    Seasonal allergies    Sleep apnea    has cpap does not use (04/28/2013)     Allergies Allergies  Allergen Reactions   Codeine Nausea And Vomiting     Review of Systems Review of Systems  Constitutional:  Negative for chills, fever and malaise/fatigue.  HENT:  Positive for congestion and sinus pain. Negative for hearing loss, sore throat and tinnitus.   Eyes:  Negative for blurred vision and double vision.  Respiratory:  Positive for cough and sputum production. Negative for hemoptysis, shortness of breath and wheezing.   Cardiovascular:  Negative for chest pain, palpitations and leg swelling.  Gastrointestinal:  Negative for abdominal pain, blood in stool, constipation, diarrhea, heartburn, melena, nausea and vomiting.  Genitourinary:  Negative for dysuria and hematuria.  Musculoskeletal:  Positive for back pain. Negative for myalgias.  Skin:  Negative for itching and rash.  Neurological:  Negative for dizziness, weakness and headaches.  Psychiatric/Behavioral:  Negative for depression. The patient is not nervous/anxious.        Objective:    Vitals BP 114/80 (BP Location: Right Arm, Patient Position:  Sitting, Cuff Size: Large)   Pulse (!) 103   Temp 98.8 F (37.1 C) (Temporal)   Resp 16   Ht 6' (1.829 m)   Wt (!) 307 lb 0.8 oz (139.3 kg)   SpO2 97%   BMI 41.64 kg/m    Physical Examination Physical Exam Constitutional:      General: He is not in acute distress.    Appearance: Normal appearance. He is not ill-appearing.  HENT:     Head: Normocephalic and atraumatic.     Right Ear: Tympanic membrane, ear canal and external ear normal.  Left Ear: Tympanic membrane, ear canal and external ear normal.     Nose: Congestion present. No rhinorrhea.     Mouth/Throat:     Mouth: Mucous membranes are moist.     Pharynx: Oropharynx is clear. No oropharyngeal exudate or posterior oropharyngeal erythema.  Eyes:     General: No scleral icterus.    Conjunctiva/sclera: Conjunctivae normal.     Pupils: Pupils are equal, round, and reactive to light.  Neck:     Vascular: No carotid bruit.     Comments: No thyromegaly Cardiovascular:     Rate and Rhythm: Normal rate and regular rhythm.     Pulses: Normal pulses.     Heart sounds: Normal heart sounds. No murmur heard.    No friction rub. No gallop.  Pulmonary:     Effort: Pulmonary effort is normal. No respiratory distress.     Breath sounds: Normal breath sounds. No wheezing, rhonchi or rales.  Abdominal:     General: Abdomen is flat. Bowel sounds are normal. There is no distension.     Palpations: Abdomen is soft.     Tenderness: There is no abdominal tenderness.  Musculoskeletal:        General: Normal range of motion.     Cervical back: Normal range of motion. No tenderness.     Right lower leg: No edema.     Left lower leg: No edema.  Lymphadenopathy:     Cervical: No cervical adenopathy.  Skin:    General: Skin is warm and dry.     Findings: No rash.  Neurological:     General: No focal deficit present.     Mental Status: He is alert and oriented to person, place, and time.  Psychiatric:        Mood and Affect: Mood  normal.        Behavior: Behavior normal.        Thought Content: Thought content normal.        Assessment & Plan:   Essential hypertension His BP is controlled.  We will continue with lisinopril.  OSA (obstructive sleep apnea) I have asked him to eat healthy and try to lose weight.  He does not use his CPAP.  Acute non-recurrent maxillary sinusitis We will place him on a course of amoxicillin and flonase.  Continue supportive care.  Gastroesophageal reflux disease without esophagitis His reflux is controlled on nexium.  Hypogonadism in male We checked his testosterone levels on his last visit and they were appropriate.  We will continue on testosterone.  Seasonal allergies Continue allegra at this time.  Morbid obesity (HCC) He has morbid obesity associated with HTN, GERD and OSA.  I want him to continue to be active and try to lose weight.  Chronic low back pain He can take tylenol as needed for any pain.  Annual physical exam Health maintenance discussed.  We will obtain some yearly labs.    Return in about 4 months (around 04/23/2022).    Crist Fat, MD

## 2021-12-23 NOTE — Assessment & Plan Note (Signed)
Health maintenance discussed.  We will obtain some yearly labs. 

## 2021-12-23 NOTE — Assessment & Plan Note (Signed)
He can take tylenol as needed for any pain.

## 2021-12-23 NOTE — Assessment & Plan Note (Signed)
Continue allegra at this time.

## 2021-12-23 NOTE — Assessment & Plan Note (Signed)
He has morbid obesity associated with HTN, GERD and OSA.  I want him to continue to be active and try to lose weight.

## 2021-12-23 NOTE — Assessment & Plan Note (Signed)
His reflux is controlled on nexium. 

## 2021-12-23 NOTE — Assessment & Plan Note (Signed)
I have asked him to eat healthy and try to lose weight.  He does not use his CPAP.

## 2021-12-24 LAB — LIPID PANEL
Chol/HDL Ratio: 3.8 ratio (ref 0.0–5.0)
Cholesterol, Total: 144 mg/dL (ref 100–199)
HDL: 38 mg/dL — ABNORMAL LOW (ref >39)
LDL Chol Calc (NIH): 86 mg/dL (ref 0–99)
Triglycerides: 107 mg/dL (ref 0–149)
VLDL Cholesterol Cal: 20 mg/dL (ref 5–40)

## 2021-12-24 LAB — CBC WITH DIFFERENTIAL/PLATELET
Basophils Absolute: 0.1 10*3/uL (ref 0.0–0.2)
Basos: 1 %
EOS (ABSOLUTE): 0.2 10*3/uL (ref 0.0–0.4)
Eos: 2 %
Hematocrit: 46.1 % (ref 37.5–51.0)
Hemoglobin: 15.5 g/dL (ref 13.0–17.7)
Immature Grans (Abs): 0.1 10*3/uL (ref 0.0–0.1)
Immature Granulocytes: 1 %
Lymphocytes Absolute: 2.5 10*3/uL (ref 0.7–3.1)
Lymphs: 28 %
MCH: 30.3 pg (ref 26.6–33.0)
MCHC: 33.6 g/dL (ref 31.5–35.7)
MCV: 90 fL (ref 79–97)
Monocytes Absolute: 0.9 10*3/uL (ref 0.1–0.9)
Monocytes: 10 %
Neutrophils Absolute: 5.3 10*3/uL (ref 1.4–7.0)
Neutrophils: 58 %
Platelets: 264 10*3/uL (ref 150–450)
RBC: 5.12 x10E6/uL (ref 4.14–5.80)
RDW: 13 % (ref 11.6–15.4)
WBC: 9.1 10*3/uL (ref 3.4–10.8)

## 2021-12-24 LAB — CMP14 + ANION GAP
ALT: 27 IU/L (ref 0–44)
AST: 32 IU/L (ref 0–40)
Albumin/Globulin Ratio: 2 (ref 1.2–2.2)
Albumin: 4.4 g/dL (ref 3.8–4.9)
Alkaline Phosphatase: 114 IU/L (ref 44–121)
Anion Gap: 15 mmol/L (ref 10.0–18.0)
BUN/Creatinine Ratio: 13 (ref 9–20)
BUN: 16 mg/dL (ref 6–24)
Bilirubin Total: 0.5 mg/dL (ref 0.0–1.2)
CO2: 23 mmol/L (ref 20–29)
Calcium: 9.2 mg/dL (ref 8.7–10.2)
Chloride: 102 mmol/L (ref 96–106)
Creatinine, Ser: 1.24 mg/dL (ref 0.76–1.27)
Globulin, Total: 2.2 g/dL (ref 1.5–4.5)
Glucose: 99 mg/dL (ref 70–99)
Potassium: 4.8 mmol/L (ref 3.5–5.2)
Sodium: 140 mmol/L (ref 134–144)
Total Protein: 6.6 g/dL (ref 6.0–8.5)
eGFR: 68 mL/min/{1.73_m2} (ref >59)

## 2021-12-24 LAB — HEMOGLOBIN A1C
Est. average glucose Bld gHb Est-mCnc: 123 mg/dL
Hgb A1c MFr Bld: 5.9 % — ABNORMAL HIGH (ref 4.8–5.6)

## 2021-12-24 LAB — PSA: Prostate Specific Ag, Serum: 1.4 ng/mL (ref 0.0–4.0)

## 2021-12-24 LAB — TSH: TSH: 1.38 u[IU]/mL (ref 0.450–4.500)

## 2022-03-12 DIAGNOSIS — L700 Acne vulgaris: Secondary | ICD-10-CM | POA: Diagnosis not present

## 2022-04-10 ENCOUNTER — Other Ambulatory Visit: Payer: Self-pay | Admitting: Internal Medicine

## 2022-04-22 ENCOUNTER — Ambulatory Visit: Payer: Federal, State, Local not specified - PPO | Admitting: Internal Medicine

## 2022-05-09 ENCOUNTER — Ambulatory Visit: Payer: Federal, State, Local not specified - PPO | Admitting: Internal Medicine

## 2022-05-09 ENCOUNTER — Encounter: Payer: Self-pay | Admitting: Internal Medicine

## 2022-05-09 VITALS — BP 138/78 | HR 95 | Temp 97.9°F | Resp 16 | Ht 72.0 in | Wt 307.0 lb

## 2022-05-09 DIAGNOSIS — I10 Essential (primary) hypertension: Secondary | ICD-10-CM | POA: Diagnosis not present

## 2022-05-09 DIAGNOSIS — E291 Testicular hypofunction: Secondary | ICD-10-CM | POA: Diagnosis not present

## 2022-05-09 MED ORDER — TESTOSTERONE CYPIONATE 200 MG/ML IM SOLN: 200.0000 mg | INTRAMUSCULAR | 0 refills | Status: DC

## 2022-05-09 NOTE — Assessment & Plan Note (Signed)
We will continue him on testosterone.

## 2022-05-09 NOTE — Progress Notes (Signed)
Office Visit  Subjective   Patient ID: Joe Holmes   DOB: 07/22/1965   Age: 57 y.o.   MRN: [id number]   Chief Complaint Chief Complaint  Patient presents with   Follow-up    Hypertension     History of Present Illness Joe Holmes returns today also for followup of his low testosterone/hypogonadism.  We did check his testosterone level in 08/2021 and this was in the 500's and I continued his testosterone from 200mcg q 2 weeks to 200mcg q 2 weeks.  Marland Kitchen.   His dose has helped with his fatigue and libido.   He does his own shots at home.   The patient is a 57 year old Caucasian/White male who presents for a follow-up evaluation of hypertension. The patient has not been checking his blood pressure at home. The patient's current medications include: lisinopril 10mg  daily. The patient has been tolerating his medications well. The patient denies any headache, visual changes, dizziness, lightheadness, chest pain, shortness of breath, weakness/numbness, and edema. He reports there have been no other symptoms noted.        Past Medical History Past Medical History:  Diagnosis Date   Arthritis    Chronic lower back pain    Complication of anesthesia    irritable,?has panic attacks   GERD (gastroesophageal reflux disease)    occ   Hypertension    Hypogonadism in male    OSA (obstructive sleep apnea)    Seasonal allergies    Sleep apnea    has cpap does not use (04/28/2013)     Allergies Allergies  Allergen Reactions   Codeine Nausea And Vomiting     Medications  Current Outpatient Medications:    Ascorbic Acid (VITAMIN C PO), Take 1 tablet by mouth daily., Disp: , Rfl:    celecoxib (CELEBREX) 200 MG capsule, TAKE ONE CAPSULE (200MG ) BY MOUTH EVERY DAY, Disp: 90 capsule, Rfl: 1   esomeprazole (NEXIUM) 40 MG capsule, Take 40 mg by mouth daily at 12 noon., Disp: , Rfl:    fluticasone (FLONASE) 50 MCG/ACT nasal spray, Place 1 spray into both nostrils in the morning and at bedtime., Disp:  15.8 mL, Rfl: 2   lisinopril (ZESTRIL) 10 MG tablet, take 1 tablet by oral route once daily, Disp: 90 tablet, Rfl: 1   testosterone cypionate (DEPOTESTOSTERONE CYPIONATE) 200 MG/ML injection, Inject 1 mL (200 mg total) into the muscle every 14 (fourteen) days., Disp: 10 mL, Rfl: 0   Review of Systems Review of Systems  Constitutional:  Negative for chills and fever.  Eyes:  Negative for blurred vision and double vision.  Respiratory:  Negative for shortness of breath.   Cardiovascular:  Negative for chest pain, palpitations and leg swelling.  Gastrointestinal:  Negative for abdominal pain, constipation, diarrhea, nausea and vomiting.  Neurological:  Negative for dizziness, seizures and headaches.       Objective:    Vitals BP 138/78   Pulse 95   Temp 97.9 F (36.6 C)   Resp 16   Ht 6' (1.829 m)   Wt (!) 307 lb (139.3 kg)   SpO2 96%   BMI 41.64 kg/m    Physical Examination Physical Exam Constitutional:      Appearance: Normal appearance. He is not ill-appearing.  Cardiovascular:     Rate and Rhythm: Normal rate and regular rhythm.     Pulses: Normal pulses.     Heart sounds: No murmur heard.    No friction rub. No gallop.  Pulmonary:  Effort: Pulmonary effort is normal. No respiratory distress.     Breath sounds: No wheezing, rhonchi or rales.  Abdominal:     General: Abdomen is flat. Bowel sounds are normal. There is no distension.     Palpations: Abdomen is soft.     Tenderness: There is no abdominal tenderness.  Musculoskeletal:     Right lower leg: No edema.     Left lower leg: No edema.  Skin:    General: Skin is warm and dry.     Findings: No rash.  Neurological:     Mental Status: He is alert.        Assessment & Plan:   Essential hypertension His BP is currently controlled.  We will continue him on his current meds.  Hypogonadism in male We will continue him on testosterone.    Return in about 6 months (around 11/08/2022) for annual.    Crist Fat, MD

## 2022-05-09 NOTE — Assessment & Plan Note (Signed)
His BP is currently controlled.  We will continue him on his current meds.

## 2022-05-25 ENCOUNTER — Other Ambulatory Visit: Payer: Self-pay | Admitting: Internal Medicine

## 2022-06-18 DIAGNOSIS — L700 Acne vulgaris: Secondary | ICD-10-CM | POA: Diagnosis not present

## 2022-07-29 DIAGNOSIS — M5416 Radiculopathy, lumbar region: Secondary | ICD-10-CM | POA: Diagnosis not present

## 2022-07-29 DIAGNOSIS — Z6838 Body mass index (BMI) 38.0-38.9, adult: Secondary | ICD-10-CM | POA: Diagnosis not present

## 2022-10-06 ENCOUNTER — Other Ambulatory Visit: Payer: Self-pay | Admitting: Internal Medicine

## 2022-11-07 ENCOUNTER — Ambulatory Visit: Payer: Federal, State, Local not specified - PPO | Admitting: Internal Medicine

## 2022-11-07 ENCOUNTER — Encounter: Payer: Self-pay | Admitting: Internal Medicine

## 2022-11-07 VITALS — BP 126/80 | HR 87 | Temp 98.0°F | Resp 17 | Ht 72.0 in | Wt 281.2 lb

## 2022-11-07 DIAGNOSIS — I10 Essential (primary) hypertension: Secondary | ICD-10-CM | POA: Diagnosis not present

## 2022-11-07 DIAGNOSIS — E291 Testicular hypofunction: Secondary | ICD-10-CM

## 2022-11-07 MED ORDER — TESTOSTERONE CYPIONATE 200 MG/ML IM SOLN: 200.0000 mg | INTRAMUSCULAR | 0 refills | Status: DC

## 2022-11-07 NOTE — Progress Notes (Signed)
Office Visit  Subjective   Patient ID: Joe Holmes   DOB: 02-01-1966   Age: 57 y.o.   MRN: [id number]   Chief Complaint Chief Complaint  Patient presents with   Annual Exam     History of Present Illness Joe Holmes returns today also for followup of his low testosterone/hypogonadism.  We did check his testosterone level in 08/2021 and this was in the 500's and I continued his testosterone from q 2 weeks to q 2 weeks.  Marland Kitchen   His dose has helped with his fatigue and libido.   He does his own shots at home.   The patient is a 57 year old Caucasian/White male who presents for a follow-up evaluation of hypertension. The patient has not been checking his blood pressure at home. The patient's current medications include: lisinopril 10mg  daily. The patient has been tolerating his medications well. The patient denies any headache, visual changes, dizziness, lightheadness, chest pain, shortness of breath, weakness/numbness, and edema. He reports there have been no other symptoms noted.  He also has lost 26 lbs over the last 5-6 months.  He has cut back on his eating.     Past Medical History Past Medical History:  Diagnosis Date   Arthritis    Chronic lower back pain    Complication of anesthesia    irritable,?has panic attacks   GERD (gastroesophageal reflux disease)    occ   Hypertension    Hypogonadism in male    OSA (obstructive sleep apnea)    Seasonal allergies    Sleep apnea    has cpap does not use (04/28/2013)     Allergies Allergies  Allergen Reactions   Codeine Nausea And Vomiting     Medications  Current Outpatient Medications:    Ascorbic Acid (VITAMIN C PO), Take 1 tablet by mouth daily., Disp: , Rfl:    celecoxib (CELEBREX) 200 MG capsule, TAKE ONE CAPSULE (200MG ) BY MOUTH EVERY DAY, Disp: 90 capsule, Rfl: 1   esomeprazole (NEXIUM) 40 MG capsule, TAKE ONE CAPSULE BY MOUTH EVERY DAY, Disp: 90 capsule, Rfl: 1   fluticasone (FLONASE) 50 MCG/ACT nasal spray,  Place 1 spray into both nostrils in the morning and at bedtime., Disp: 15.8 mL, Rfl: 2   lisinopril (ZESTRIL) 10 MG tablet, TAKE ONE TABLET BY MOUTH ONCE DAILY, Disp: 90 tablet, Rfl: 1   testosterone cypionate (DEPOTESTOSTERONE CYPIONATE) 200 MG/ML injection, Inject 1 mL (200 mg total) into the muscle every 14 (fourteen) days., Disp: 10 mL, Rfl: 0   Review of Systems Review of Systems  Constitutional:  Negative for chills and fever.  Eyes:  Negative for blurred vision and double vision.  Respiratory:  Negative for shortness of breath.   Cardiovascular:  Negative for chest pain, palpitations and leg swelling.  Neurological:  Negative for dizziness, weakness and headaches.       Objective:    Vitals BP 126/80   Pulse 87   Temp 98 F (36.7 C)   Resp 17   Ht 6' (1.829 m)   Wt 281 lb 3.2 oz (127.6 kg)   SpO2 98%   BMI 38.14 kg/m    Physical Examination Physical Exam Constitutional:      Appearance: Normal appearance. He is not ill-appearing.  Cardiovascular:     Rate and Rhythm: Normal rate and regular rhythm.     Pulses: Normal pulses.     Heart sounds: No murmur heard.    No friction rub. No gallop.  Pulmonary:     Effort: Pulmonary effort is normal. No respiratory distress.     Breath sounds: No wheezing, rhonchi or rales.  Abdominal:     General: Abdomen is flat. Bowel sounds are normal. There is no distension.     Palpations: Abdomen is soft.     Tenderness: There is no abdominal tenderness.  Musculoskeletal:     Right lower leg: No edema.     Left lower leg: No edema.  Skin:    General: Skin is warm and dry.     Findings: No rash.  Neurological:     Mental Status: He is alert.        Assessment & Plan:   Essential hypertension His BP is doing well.  Continue on his lisinopril.  Continue with weight loss measures.  Hypogonadism in male We will restart his testosterone and check his levels on his next visit.      Return in about 3 months (around  02/07/2023).   Crist Fat, MD

## 2022-11-07 NOTE — Assessment & Plan Note (Signed)
His BP is doing well.  Continue on his lisinopril.  Continue with weight loss measures.

## 2022-11-07 NOTE — Assessment & Plan Note (Addendum)
We will restart his testosterone and check his levels on his next visit.

## 2022-11-19 ENCOUNTER — Other Ambulatory Visit: Payer: Self-pay | Admitting: Internal Medicine

## 2022-11-20 ENCOUNTER — Other Ambulatory Visit: Payer: Self-pay

## 2022-11-20 MED ORDER — CELECOXIB 200 MG PO CAPS: 200.0000 mg | ORAL_CAPSULE | Freq: Every day | ORAL | 1 refills | Status: DC

## 2022-11-20 NOTE — Progress Notes (Signed)
Rx Refill

## 2023-02-11 ENCOUNTER — Encounter: Payer: Self-pay | Admitting: Internal Medicine

## 2023-02-11 ENCOUNTER — Ambulatory Visit: Payer: Federal, State, Local not specified - PPO | Admitting: Internal Medicine

## 2023-02-11 VITALS — BP 124/80 | HR 74 | Temp 98.4°F | Resp 18 | Ht 72.0 in | Wt 292.0 lb

## 2023-02-11 DIAGNOSIS — Z0000 Encounter for general adult medical examination without abnormal findings: Secondary | ICD-10-CM

## 2023-02-11 DIAGNOSIS — I10 Essential (primary) hypertension: Secondary | ICD-10-CM

## 2023-02-11 DIAGNOSIS — K219 Gastro-esophageal reflux disease without esophagitis: Secondary | ICD-10-CM

## 2023-02-11 DIAGNOSIS — E291 Testicular hypofunction: Secondary | ICD-10-CM

## 2023-02-11 DIAGNOSIS — M545 Low back pain, unspecified: Secondary | ICD-10-CM

## 2023-02-11 DIAGNOSIS — G8929 Other chronic pain: Secondary | ICD-10-CM

## 2023-02-11 DIAGNOSIS — G4733 Obstructive sleep apnea (adult) (pediatric): Secondary | ICD-10-CM

## 2023-02-11 DIAGNOSIS — J302 Other seasonal allergic rhinitis: Secondary | ICD-10-CM

## 2023-02-11 DIAGNOSIS — Z6841 Body Mass Index (BMI) 40.0 and over, adult: Secondary | ICD-10-CM

## 2023-02-11 DIAGNOSIS — Z6839 Body mass index (BMI) 39.0-39.9, adult: Secondary | ICD-10-CM | POA: Diagnosis not present

## 2023-02-11 MED ORDER — TESTOSTERONE CYPIONATE 200 MG/ML IM SOLN: 200.0000 mg | INTRAMUSCULAR | 0 refills | Status: DC

## 2023-02-11 NOTE — Assessment & Plan Note (Signed)
 He has OSA but does not wear CPAP due to him not tolerating it.  I want him to work on weight loss measures.

## 2023-02-11 NOTE — Assessment & Plan Note (Signed)
 His BP is controlled.  We will continue him on lisinopril.

## 2023-02-11 NOTE — Assessment & Plan Note (Signed)
 As above.

## 2023-02-11 NOTE — Assessment & Plan Note (Signed)
 He has no symptoms while on nexium.

## 2023-02-11 NOTE — Assessment & Plan Note (Signed)
 This is controlled.  Continue with allegra.

## 2023-02-11 NOTE — Progress Notes (Signed)
 Office Visit  Subjective   Patient ID: Joe Holmes   DOB: 04/23/1965   Age: 58 y.o.   MRN: [id number]   Chief Complaint Chief Complaint  Patient presents with   Annual Exam    Annual exam      History of Present Illness Joe Holmes is a 58 year old Caucasian/White male who presents for his annual health maintenance exam.  This patient's past medical history Hypertension, Hypogonadism, Male, and Obstructive Sleep Apnea. He is currently due for following maintenance studies:  eye exam, yearly labs.   His last eye exam was in 2024 and he states his vision is doing well. He had a repeat colonoscopy on 09/14/20 which was normal. They want to repeat this in 5 years. His previous colonoscopy was done in 08/2015 and this showed polyps.  He also has a history of reflux but this is controlled on Nexium. He denies any problems with urination.   He does not smoke.  He is not exercising regularly.  He does not get yearly flu vaccines. He has not had any of the COVID vaccines. He did contract COVID in 08/2019 but denies any long term sequalae. He denies any depression or anxiety. There is no family history of CAD or stroke. He is on an ASA 81mg  daily.   Joe Holmes returns today also for followup of his low testosterone /hypogonadism.  We did check his testosterone  level in 08/2021 and this was in the 500's and I continued his testosterone  from 200mcg q 2 weeks to 200mcg q 2 weeks.  His dose has helped with his fatigue and libido.   He does his own shots at home.   The patient is a 58 year old Caucasian/White male who presents for a follow-up evaluation of hypertension. The patient has not been checking his blood pressure at home. The patient's current medications include: lisinopril  10mg  daily. The patient has been tolerating his medications well. The patient denies any headache, visual changes, dizziness, lightheadness, chest pain, shortness of breath, weakness/numbness, and edema. He reports there have been no  other symptoms noted.  He also has lost 26 lbs over the last 5-6 months.  He has cut back on his eating.   The patient also has a history of chronic low back pain where he was placed on disability in 2015.  Over this past year, there is no change in his chronic pain.  He worked as a copywriter, advertising for [employer] where he went on disability in 2015 after his last back surgery.  He states the numbness and weakness in his bilateral feet is unchanged since his last visit.  The patient was seeing Dr. Joshua in neurosurgery where he had surgery done in 04/2013 with fusion of L3-L4.  He was on vicodin but stopped it in 2020.  He states that he did not have the same outcome as his previous L4-L5 fusion done in 2007 and he is still having pain.  He has low back pain that is continuous dull aching pain with radiation down the lateral sides of his bilateral thighs and radiates down his feet to the lateral side of his feet.  Dr. Joshua was writing him for percocet 5/325mg  1-2 tabs po q 6 hr prn but Joe Holmes stopped all his pain meds.  He also was using alcohol for his pain but he has not had any alcohol use since 2020.  He had some elevated liver enzymes in the past but again, he quit drinking and they  went back to normal.  The patient has a history of low back pain where he fell off a back of a truck in 2007 and had a crush injury of his back.  He had ESI's in the past and eventually underwent fusion L4-L5 in 2007.  He has had low back pain since then.  He states he will get numbness if he sits on the toilet but no loss of bowel/bladder function.  Ever since his surgery in 04/2013, he has had weakness in both legs.  He has not had PT and has not had any falls.  He worked for [employer] and since his surgery he has lost his job secondary to him having weakness in his legs and he is unable to climb power poles now.   He used to take tylenol  for pain but now he just rests and try to cope with his pain.    Joe Holmes also has  a history of seasonal allergies.  He states his symptoms effect him throughout the year.    He will usually have symptoms of congestion with headache and sometimes sneezing with runny clear nose.  He does take allegra which helps.        He does have OSA but he does not wear a CPAP.  He does have a CPAP at home but he is not able to tolerate this.      Past Medical History Past Medical History:  Diagnosis Date   Arthritis    Chronic lower back pain    Complication of anesthesia    irritable,?has panic attacks   GERD (gastroesophageal reflux disease)    occ   Hypertension    Hypogonadism in male    OSA (obstructive sleep apnea)    Seasonal allergies    Sleep apnea    has cpap does not use (04/28/2013)     Allergies Allergies  Allergen Reactions   Codeine Nausea And Vomiting     Medications  Current Outpatient Medications:    Ascorbic Acid (VITAMIN C PO), Take 1 tablet by mouth daily., Disp: , Rfl:    celecoxib  (CELEBREX ) 200 MG capsule, Take 1 capsule (200 mg total) by mouth daily., Disp: 90 capsule, Rfl: 1   esomeprazole (NEXIUM) 40 MG capsule, TAKE ONE CAPSULE BY MOUTH EVERY DAY, Disp: 90 capsule, Rfl: 1   fluticasone  (FLONASE ) 50 MCG/ACT nasal spray, Place 1 spray into both nostrils in the morning and at bedtime., Disp: 15.8 mL, Rfl: 2   lisinopril  (ZESTRIL ) 10 MG tablet, TAKE ONE TABLET BY MOUTH ONCE DAILY, Disp: 90 tablet, Rfl: 1   testosterone  cypionate (DEPOTESTOSTERONE CYPIONATE) 200 MG/ML injection, Inject 1 mL (200 mg total) into the muscle every 14 (fourteen) days., Disp: 10 mL, Rfl: 0   Review of Systems Review of Systems  Constitutional:  Negative for chills, fever and malaise/fatigue.  Eyes:  Negative for blurred vision and double vision.  Respiratory:  Negative for cough, hemoptysis, shortness of breath and wheezing.   Cardiovascular:  Negative for chest pain, palpitations and leg swelling.  Gastrointestinal:  Negative for abdominal pain, blood in stool,  constipation, diarrhea, heartburn, melena, nausea and vomiting.  Genitourinary:  Negative for frequency and hematuria.  Musculoskeletal:  Positive for back pain. Negative for myalgias.  Skin:  Negative for itching and rash.  Neurological:  Negative for dizziness, weakness and headaches.  Endo/Heme/Allergies:  Negative for polydipsia.  Psychiatric/Behavioral:  Negative for depression. The patient is not nervous/anxious.        Objective:  Vitals BP 124/80 (BP Location: Left Arm, Patient Position: Sitting, Cuff Size: Normal)   Pulse 74   Temp 98.4 F (36.9 C)   Resp 18   Ht 6' (1.829 m)   Wt 292 lb (132.5 kg)   SpO2 97%   BMI 39.60 kg/m    Physical Examination Physical Exam Constitutional:      Appearance: Normal appearance. He is not ill-appearing.  HENT:     Head: Normocephalic and atraumatic.     Right Ear: Tympanic membrane, ear canal and external ear normal.     Left Ear: Tympanic membrane, ear canal and external ear normal.     Nose: Nose normal. No congestion or rhinorrhea.     Mouth/Throat:     Mouth: Mucous membranes are moist.     Pharynx: Oropharynx is clear. No oropharyngeal exudate or posterior oropharyngeal erythema.  Eyes:     General: No scleral icterus.    Conjunctiva/sclera: Conjunctivae normal.     Pupils: Pupils are equal, round, and reactive to light.  Neck:     Vascular: No carotid bruit.  Cardiovascular:     Rate and Rhythm: Normal rate and regular rhythm.     Pulses: Normal pulses.     Heart sounds: No murmur heard.    No friction rub. No gallop.  Pulmonary:     Effort: Pulmonary effort is normal. No respiratory distress.     Breath sounds: No wheezing, rhonchi or rales.  Abdominal:     General: Abdomen is flat. Bowel sounds are normal. There is no distension.     Palpations: Abdomen is soft.     Tenderness: There is no abdominal tenderness.  Musculoskeletal:     Cervical back: Neck supple. No tenderness.     Right lower leg: No edema.      Left lower leg: No edema.  Lymphadenopathy:     Cervical: No cervical adenopathy.  Skin:    General: Skin is warm and dry.     Findings: No rash.  Neurological:     General: No focal deficit present.     Mental Status: He is alert and oriented to person, place, and time.  Psychiatric:        Mood and Affect: Mood normal.        Behavior: Behavior normal.        Assessment & Plan:   Essential hypertension His BP is controlled.  We will continue him on lisinopril .  OSA (obstructive sleep apnea) He has OSA but does not wear CPAP due to him not tolerating it.  I want him to work on weight loss measures.  Gastroesophageal reflux disease without esophagitis He has no symptoms while on nexium.  Hypogonadism in male We will check his testosterone  levels today.  HE remains on supplmentation.  Seasonal allergies This is controlled.  Continue with allegra.  Chronic low back pain He is s/p lumbar fusion.  The cold makes his pain worse.  He can take tylenol  as needed.  Morbid obesity (HCC) He has morbid obesity associated with HTN and OSA. I want him to eat healthy, reduce calories, and exercise and try to lose weight.  BMI 40.0-44.9, adult (HCC) As above.  BMI 39.0-39.9,adult As above.  Annual physical exam Health maintenance discussed.  We will obtain some yearly labs.    Return in about 4 months (around 06/11/2023).   Selinda Fleeta Finger, MD

## 2023-02-11 NOTE — Assessment & Plan Note (Signed)
 He has morbid obesity associated with HTN and OSA. I want him to eat healthy, reduce calories, and exercise and try to lose weight.

## 2023-02-11 NOTE — Assessment & Plan Note (Signed)
 He is s/p lumbar fusion.  The cold makes his pain worse.  He can take tylenol as needed.

## 2023-02-11 NOTE — Assessment & Plan Note (Signed)
 Health maintenance discussed.  We will obtain some yearly labs.

## 2023-02-11 NOTE — Assessment & Plan Note (Signed)
 We will check his testosterone levels today.  HE remains on supplmentation.

## 2023-02-12 LAB — CBC WITH DIFFERENTIAL/PLATELET
Basophils Absolute: 0.1 10*3/uL (ref 0.0–0.2)
Basos: 1 %
EOS (ABSOLUTE): 0.2 10*3/uL (ref 0.0–0.4)
Eos: 2 %
Hematocrit: 43.6 % (ref 37.5–51.0)
Hemoglobin: 14.8 g/dL (ref 13.0–17.7)
Immature Grans (Abs): 0 10*3/uL (ref 0.0–0.1)
Immature Granulocytes: 1 %
Lymphocytes Absolute: 2.4 10*3/uL (ref 0.7–3.1)
Lymphs: 30 %
MCH: 31.1 pg (ref 26.6–33.0)
MCHC: 33.9 g/dL (ref 31.5–35.7)
MCV: 92 fL (ref 79–97)
Monocytes Absolute: 0.7 10*3/uL (ref 0.1–0.9)
Monocytes: 9 %
Neutrophils Absolute: 4.7 10*3/uL (ref 1.4–7.0)
Neutrophils: 57 %
Platelets: 242 10*3/uL (ref 150–450)
RBC: 4.76 x10E6/uL (ref 4.14–5.80)
RDW: 12.6 % (ref 11.6–15.4)
WBC: 8.1 10*3/uL (ref 3.4–10.8)

## 2023-02-12 LAB — CMP14 + ANION GAP
ALT: 20 [IU]/L (ref 0–44)
AST: 20 [IU]/L (ref 0–40)
Albumin: 4.4 g/dL (ref 3.8–4.9)
Alkaline Phosphatase: 110 [IU]/L (ref 44–121)
Anion Gap: 16 mmol/L (ref 10.0–18.0)
BUN/Creatinine Ratio: 17 (ref 9–20)
BUN: 17 mg/dL (ref 6–24)
Bilirubin Total: 0.6 mg/dL (ref 0.0–1.2)
CO2: 20 mmol/L (ref 20–29)
Calcium: 9 mg/dL (ref 8.7–10.2)
Chloride: 103 mmol/L (ref 96–106)
Creatinine, Ser: 0.99 mg/dL (ref 0.76–1.27)
Globulin, Total: 2.2 g/dL (ref 1.5–4.5)
Glucose: 97 mg/dL (ref 70–99)
Potassium: 4.3 mmol/L (ref 3.5–5.2)
Sodium: 139 mmol/L (ref 134–144)
Total Protein: 6.6 g/dL (ref 6.0–8.5)
eGFR: 88 mL/min/{1.73_m2} (ref >59)

## 2023-02-12 LAB — LIPID PANEL
Chol/HDL Ratio: 2.9 {ratio} (ref 0.0–5.0)
Cholesterol, Total: 147 mg/dL (ref 100–199)
HDL: 50 mg/dL (ref >39)
LDL Chol Calc (NIH): 83 mg/dL (ref 0–99)
Triglycerides: 73 mg/dL (ref 0–149)
VLDL Cholesterol Cal: 14 mg/dL (ref 5–40)

## 2023-02-12 LAB — TSH: TSH: 1.6 u[IU]/mL (ref 0.450–4.500)

## 2023-02-12 LAB — HEMOGLOBIN A1C
Est. average glucose Bld gHb Est-mCnc: 114 mg/dL
Hgb A1c MFr Bld: 5.6 % (ref 4.8–5.6)

## 2023-02-12 LAB — PSA: Prostate Specific Ag, Serum: 1.1 ng/mL (ref 0.0–4.0)

## 2023-02-12 LAB — TESTOSTERONE: Testosterone: 298 ng/dL (ref 264–916)

## 2023-03-03 NOTE — Progress Notes (Signed)
Joe Holmes, Barbara Cower, MD  Christianne Dolin, CMA His labs look good but his testosterone is on lower side of normal.  If he is doing well, I would not touch his dose,  Patient informed. 03/03/23 08:18

## 2023-05-04 ENCOUNTER — Other Ambulatory Visit: Payer: Self-pay | Admitting: Internal Medicine

## 2023-06-11 ENCOUNTER — Ambulatory Visit: Payer: Federal, State, Local not specified - PPO | Admitting: Internal Medicine

## 2023-06-16 ENCOUNTER — Other Ambulatory Visit: Payer: Self-pay | Admitting: Internal Medicine

## 2023-07-29 ENCOUNTER — Ambulatory Visit: Admitting: Internal Medicine

## 2023-07-29 ENCOUNTER — Encounter: Payer: Self-pay | Admitting: Internal Medicine

## 2023-07-29 VITALS — BP 128/78 | HR 76 | Temp 98.0°F | Resp 18 | Ht 72.0 in | Wt 297.4 lb

## 2023-07-29 DIAGNOSIS — I10 Essential (primary) hypertension: Secondary | ICD-10-CM | POA: Diagnosis not present

## 2023-07-29 DIAGNOSIS — E291 Testicular hypofunction: Secondary | ICD-10-CM

## 2023-07-29 MED ORDER — TESTOSTERONE CYPIONATE 200 MG/ML IM SOLN: 200.0000 mg | INTRAMUSCULAR | 0 refills | Status: AC

## 2023-07-29 NOTE — Assessment & Plan Note (Signed)
 His BP is well controlled. We will continue on lisinopril.

## 2023-07-29 NOTE — Assessment & Plan Note (Signed)
 On his yearly exam, his testosterone  level was on the lower side of normal but he is not having any complaints.  We will continue on his current dose of testosterone .

## 2023-07-29 NOTE — Progress Notes (Signed)
 Office Visit  Subjective   Patient ID: Joe Holmes   DOB: Jun 06, 1965   Age: 58 y.o.   MRN: [id number]   Chief Complaint Chief Complaint  Patient presents with   Follow-up    4 month      History of Present Illness The patient is a 58 year old Caucasian/White male who presents for a follow-up evaluation of hypertension.  Since his last visit, he states he has not had any problems.  The patient has not been checking his blood pressure at home. The patient's current medications include: lisinopril  10mg  daily. The patient has been tolerating his medications well. The patient denies any headache, visual changes, dizziness, lightheadness, chest pain, shortness of breath, weakness/numbness, and edema. He reports there have been no other symptoms noted.  He also has lost 26 lbs over the last 5-6 months.  He has cut back on his eating.  Joe Holmes also for followup of his low testosterone /hypogonadism.  We did check his testosterone  level in 08/2021 and this was in the 500's and I continued his testosterone  from 200mcg q 2 weeks to 200mcg q 2 weeks.  His dose has helped with his fatigue and libido.   He does his own shots at home.  He has not had any problems.       Past Medical History Past Medical History:  Diagnosis Date   Arthritis    Chronic lower back pain    Complication of anesthesia    irritable,?has panic attacks   GERD (gastroesophageal reflux disease)    occ   Hypertension    Hypogonadism in male    OSA (obstructive sleep apnea)    Seasonal allergies    Sleep apnea    has cpap does not use (04/28/2013)     Allergies Allergies  Allergen Reactions   Codeine Nausea And Vomiting     Medications  Current Outpatient Medications:    Ascorbic Acid (VITAMIN C PO), Take 1 tablet by mouth daily., Disp: , Rfl:    celecoxib  (CELEBREX ) 200 MG capsule, Take 1 capsule (200 mg total) by mouth daily., Disp: 90 capsule, Rfl: 1   esomeprazole (NEXIUM) 40 MG capsule, TAKE ONE  CAPSULE BY MOUTH EVERY DAY, Disp: 90 capsule, Rfl: 1   fluticasone  (FLONASE ) 50 MCG/ACT nasal spray, Place 1 spray into both nostrils in the morning and at bedtime. (Patient not taking: Reported on 07/29/2023), Disp: 15.8 mL, Rfl: 2   lisinopril  (ZESTRIL ) 10 MG tablet, TAKE ONE TABLET BY MOUTH ONCE DAILY, Disp: 90 tablet, Rfl: 1   testosterone  cypionate (DEPOTESTOSTERONE CYPIONATE) 200 MG/ML injection, Inject 1 mL (200 mg total) into the muscle every 14 (fourteen) days., Disp: 10 mL, Rfl: 0   Review of Systems Review of Systems  Constitutional:  Negative for chills and fever.  Eyes:  Negative for blurred vision and double vision.  Respiratory:  Negative for cough and shortness of breath.   Cardiovascular:  Negative for chest pain, palpitations and leg swelling.  Gastrointestinal:  Negative for abdominal pain, constipation, diarrhea, nausea and vomiting.  Neurological:  Negative for dizziness, weakness and headaches.       Objective:    Vitals BP 128/78 (BP Location: Left Arm, Patient Position: Sitting, Cuff Size: Large)   Pulse 76   Temp 98 F (36.7 C)   Resp 18   Ht 6' (1.829 m)   Wt 297 lb 6 oz (134.9 kg)   SpO2 96%   BMI 40.33 kg/m    Physical  Examination Physical Exam Constitutional:      Appearance: Normal appearance. He is not ill-appearing.   Cardiovascular:     Rate and Rhythm: Normal rate and regular rhythm.     Pulses: Normal pulses.     Heart sounds: No murmur heard.    No friction rub. No gallop.  Pulmonary:     Effort: Pulmonary effort is normal. No respiratory distress.     Breath sounds: No wheezing, rhonchi or rales.  Abdominal:     General: Abdomen is flat. Bowel sounds are normal. There is no distension.     Palpations: Abdomen is soft.     Tenderness: There is no abdominal tenderness.   Musculoskeletal:     Right lower leg: No edema.     Left lower leg: No edema.   Skin:    General: Skin is warm and dry.     Findings: No rash.   Neurological:      Mental Status: He is alert.        Assessment & Plan:   Essential hypertension His BP is well controlled.  We will continue on lisinopril .  Hypogonadism in male On his yearly exam, his testosterone  level was on the lower side of normal but he is not having any complaints.  We will continue on his current dose of testosterone .    Return in about 4 months (around 11/28/2023).   Selinda Fleeta Finger, MD

## 2023-10-29 ENCOUNTER — Other Ambulatory Visit: Payer: Self-pay | Admitting: Internal Medicine

## 2023-11-27 ENCOUNTER — Encounter: Payer: Self-pay | Admitting: Internal Medicine

## 2023-11-27 ENCOUNTER — Ambulatory Visit: Admitting: Internal Medicine

## 2023-11-27 VITALS — BP 128/82 | HR 83 | Temp 98.0°F | Resp 18 | Ht 72.0 in | Wt 297.4 lb

## 2023-11-27 DIAGNOSIS — R002 Palpitations: Secondary | ICD-10-CM | POA: Diagnosis not present

## 2023-11-27 DIAGNOSIS — I10 Essential (primary) hypertension: Secondary | ICD-10-CM

## 2023-11-27 NOTE — Assessment & Plan Note (Signed)
 His EKG shows NSR.  He is probably having reflux.  He is to continue on a PPI.

## 2023-11-27 NOTE — Progress Notes (Signed)
 Office Visit  Subjective   Patient ID: Joe Holmes   DOB: 08/13/1965   Age: 58 y.o.   MRN: [id number]   Chief Complaint Chief Complaint  Patient presents with   Follow-up     History of Present Illness The patient is a 58 year old Caucasian/White male who presents for a follow-up evaluation of hypertension.  The patient has not been checking his blood pressure at home. The patient's current medications include: lisinopril  10mg  daily. The patient has been tolerating his medications well. The patient denies any headache, visual changes, dizziness, lightheadness, chest pain, shortness of breath, weakness/numbness, and edema. He reports there have been no other symptoms noted.    The patient states that last week he has some stomach upset with diarrhea.  However, he had heart palpiations at that time that lasted for about 5 min.  There was no associated chest pain, SOB, generalized weakness or syncope.  He states he has been having some reflux.       Past Medical History Past Medical History:  Diagnosis Date   Arthritis    Chronic lower back pain    Complication of anesthesia    irritable,?has panic attacks   GERD (gastroesophageal reflux disease)    occ   Hypertension    Hypogonadism in male    OSA (obstructive sleep apnea)    Seasonal allergies    Sleep apnea    has cpap does not use (04/28/2013)     Allergies Allergies  Allergen Reactions   Codeine Nausea And Vomiting     Medications  Current Outpatient Medications:    Ascorbic Acid (VITAMIN C PO), Take 1 tablet by mouth daily., Disp: , Rfl:    celecoxib  (CELEBREX ) 200 MG capsule, Take 1 capsule (200 mg total) by mouth daily., Disp: 90 capsule, Rfl: 1   esomeprazole (NEXIUM) 40 MG capsule, TAKE ONE CAPSULE BY MOUTH EVERY DAY, Disp: 90 capsule, Rfl: 1   fluticasone  (FLONASE ) 50 MCG/ACT nasal spray, Place 1 spray into both nostrils in the morning and at bedtime. (Patient not taking: Reported on 07/29/2023), Disp: 15.8  mL, Rfl: 2   lisinopril  (ZESTRIL ) 10 MG tablet, TAKE ONE TABLET BY MOUTH ONCE DAILY, Disp: 90 tablet, Rfl: 1   testosterone  cypionate (DEPOTESTOSTERONE CYPIONATE) 200 MG/ML injection, Inject 1 mL (200 mg total) into the muscle every 14 (fourteen) days., Disp: 10 mL, Rfl: 0   Review of Systems Review of Systems  Constitutional:  Negative for chills and fever.  Eyes:  Negative for blurred vision and double vision.  Respiratory:  Negative for cough and shortness of breath.   Cardiovascular:  Negative for chest pain, palpitations and leg swelling.  Gastrointestinal:  Positive for heartburn. Negative for abdominal pain, constipation, diarrhea, nausea and vomiting.  Musculoskeletal:  Negative for myalgias.  Skin:  Negative for itching and rash.  Neurological:  Negative for dizziness, weakness and headaches.       Objective:    Vitals BP 128/82 (BP Location: Left Arm, Patient Position: Sitting, Cuff Size: Large)   Pulse 83   Temp 98 F (36.7 C)   Resp 18   Ht 6' (1.829 m)   Wt 297 lb 6 oz (134.9 kg)   SpO2 95%   BMI 40.33 kg/m    Physical Examination Physical Exam Constitutional:      Appearance: Normal appearance. He is not ill-appearing.  Cardiovascular:     Rate and Rhythm: Normal rate and regular rhythm.     Pulses: Normal pulses.  Heart sounds: No murmur heard.    No friction rub. No gallop.  Pulmonary:     Effort: Pulmonary effort is normal. No respiratory distress.     Breath sounds: No wheezing, rhonchi or rales.  Abdominal:     General: Abdomen is flat. Bowel sounds are normal. There is no distension.     Palpations: Abdomen is soft.     Tenderness: There is no abdominal tenderness.  Musculoskeletal:     Right lower leg: No edema.     Left lower leg: No edema.  Skin:    General: Skin is warm and dry.     Findings: No rash.  Neurological:     Mental Status: He is alert.        Assessment & Plan:   Essential hypertension His BP is controlled. We will  continue on lisinopril .  Palpitations His EKG shows NSR.  He is probably having reflux.  He is to continue on a PPI.    No follow-ups on file.   Selinda Fleeta Finger, MD

## 2023-11-27 NOTE — Assessment & Plan Note (Signed)
His BP is controlled.  We will continue on lisinopril. 

## 2023-12-29 ENCOUNTER — Other Ambulatory Visit: Payer: Self-pay | Admitting: Internal Medicine

## 2024-03-03 ENCOUNTER — Encounter: Admitting: Internal Medicine

## 2024-03-03 ENCOUNTER — Encounter

## 2024-04-07 ENCOUNTER — Encounter
